# Patient Record
Sex: Male | Born: 1955
Health system: Southern US, Community
[De-identification: ages and names within clinical notes are randomized; demographics above are authoritative.]

## PROBLEM LIST (undated history)

## (undated) DIAGNOSIS — E785 Hyperlipidemia, unspecified: Secondary | ICD-10-CM

## (undated) DIAGNOSIS — E119 Type 2 diabetes mellitus without complications: Secondary | ICD-10-CM

## (undated) DIAGNOSIS — K253 Acute gastric ulcer without hemorrhage or perforation: Secondary | ICD-10-CM

## (undated) DIAGNOSIS — K219 Gastro-esophageal reflux disease without esophagitis: Secondary | ICD-10-CM

## (undated) DIAGNOSIS — F419 Anxiety disorder, unspecified: Secondary | ICD-10-CM

## (undated) HISTORY — PX: SHOULDER SURGERY: SHX246

## (undated) HISTORY — DX: Hyperlipidemia, unspecified: E78.5

## (undated) HISTORY — DX: Anxiety disorder, unspecified: F41.9

---

## 2003-01-27 HISTORY — PX: GASTRIC BYPASS: SHX52

## 2014-04-19 ENCOUNTER — Encounter (HOSPITAL_BASED_OUTPATIENT_CLINIC_OR_DEPARTMENT_OTHER): Payer: Self-pay | Admitting: *Deleted

## 2014-04-19 ENCOUNTER — Emergency Department (HOSPITAL_BASED_OUTPATIENT_CLINIC_OR_DEPARTMENT_OTHER)
Admission: EM | Admit: 2014-04-19 | Discharge: 2014-04-19 | Disposition: A | Discharge status: Home / Self Care | Payer: BC Managed Care – PPO | Attending: Emergency Medicine | Admitting: Emergency Medicine

## 2014-04-19 ENCOUNTER — Emergency Department (HOSPITAL_BASED_OUTPATIENT_CLINIC_OR_DEPARTMENT_OTHER): Payer: BC Managed Care – PPO

## 2014-04-19 DIAGNOSIS — Z87891 Personal history of nicotine dependence: Secondary | ICD-10-CM | POA: Insufficient documentation

## 2014-04-19 DIAGNOSIS — R059 Cough, unspecified: Secondary | ICD-10-CM

## 2014-04-19 DIAGNOSIS — R05 Cough: Secondary | ICD-10-CM

## 2014-04-19 DIAGNOSIS — R1030 Lower abdominal pain, unspecified: Secondary | ICD-10-CM | POA: Diagnosis present

## 2014-04-19 DIAGNOSIS — K219 Gastro-esophageal reflux disease without esophagitis: Secondary | ICD-10-CM | POA: Insufficient documentation

## 2014-04-19 DIAGNOSIS — E119 Type 2 diabetes mellitus without complications: Secondary | ICD-10-CM | POA: Insufficient documentation

## 2014-04-19 DIAGNOSIS — J159 Unspecified bacterial pneumonia: Secondary | ICD-10-CM | POA: Insufficient documentation

## 2014-04-19 DIAGNOSIS — J189 Pneumonia, unspecified organism: Secondary | ICD-10-CM

## 2014-04-19 HISTORY — DX: Gastro-esophageal reflux disease without esophagitis: K21.9

## 2014-04-19 HISTORY — DX: Acute gastric ulcer without hemorrhage or perforation: K25.3

## 2014-04-19 HISTORY — DX: Type 2 diabetes mellitus without complications: E11.9

## 2014-04-19 LAB — COMPREHENSIVE METABOLIC PANEL
ALT: 44 U/L (ref 0–53)
AST: 46 U/L — ABNORMAL HIGH (ref 0–37)
Albumin: 4 g/dL (ref 3.5–5.2)
Alkaline Phosphatase: 50 U/L (ref 39–117)
Anion gap: 7 (ref 5–15)
BUN: 17 mg/dL (ref 6–23)
CHLORIDE: 104 mmol/L (ref 96–112)
CO2: 26 mmol/L (ref 19–32)
Calcium: 9.3 mg/dL (ref 8.4–10.5)
Creatinine, Ser: 1.12 mg/dL (ref 0.50–1.35)
GFR calc Af Amer: 81 mL/min — ABNORMAL LOW (ref >90)
GFR, EST NON AFRICAN AMERICAN: 70 mL/min — AB (ref >90)
GLUCOSE: 263 mg/dL — AB (ref 70–99)
POTASSIUM: 4.2 mmol/L (ref 3.5–5.1)
SODIUM: 137 mmol/L (ref 135–145)
TOTAL PROTEIN: 7.4 g/dL (ref 6.0–8.3)
Total Bilirubin: 0.7 mg/dL (ref 0.3–1.2)

## 2014-04-19 LAB — URINALYSIS, ROUTINE W REFLEX MICROSCOPIC
BILIRUBIN URINE: NEGATIVE
Glucose, UA: >1000 mg/dL — AB
Hgb urine dipstick: NEGATIVE
Ketones, ur: NEGATIVE mg/dL
LEUKOCYTES UA: NEGATIVE
Nitrite: NEGATIVE
PH: 5 (ref 5.0–8.0)
Protein, ur: NEGATIVE mg/dL
Specific Gravity, Urine: 1.035 — ABNORMAL HIGH (ref 1.005–1.030)
Urobilinogen, UA: 0.2 mg/dL (ref 0.0–1.0)

## 2014-04-19 LAB — URINE MICROSCOPIC-ADD ON

## 2014-04-19 LAB — CBC WITH DIFFERENTIAL/PLATELET
Basophils Absolute: 0 10*3/uL (ref 0.0–0.1)
Basophils Relative: 0 % (ref 0–1)
Eosinophils Absolute: 0.1 10*3/uL (ref 0.0–0.7)
Eosinophils Relative: 1 % (ref 0–5)
HEMATOCRIT: 39 % (ref 39.0–52.0)
Hemoglobin: 13.7 g/dL (ref 13.0–17.0)
LYMPHS PCT: 27 % (ref 12–46)
Lymphs Abs: 2.8 10*3/uL (ref 0.7–4.0)
MCH: 30.6 pg (ref 26.0–34.0)
MCHC: 35.1 g/dL (ref 30.0–36.0)
MCV: 87.2 fL (ref 78.0–100.0)
MONOS PCT: 7 % (ref 3–12)
Monocytes Absolute: 0.7 10*3/uL (ref 0.1–1.0)
NEUTROS ABS: 6.8 10*3/uL (ref 1.7–7.7)
NEUTROS PCT: 65 % (ref 43–77)
Platelets: 128 10*3/uL — ABNORMAL LOW (ref 150–400)
RBC: 4.47 MIL/uL (ref 4.22–5.81)
RDW: 12.8 % (ref 11.5–15.5)
WBC: 10.4 10*3/uL (ref 4.0–10.5)

## 2014-04-19 LAB — TROPONIN I: Troponin I: <0.03 ng/mL (ref <0.031)

## 2014-04-19 LAB — LIPASE, BLOOD: Lipase: 27 U/L (ref 11–59)

## 2014-04-19 MED ORDER — LEVOFLOXACIN 750 MG PO TABS
750.0000 mg | ORAL_TABLET | Freq: Once | ORAL | Status: AC
  Administered 2014-04-19: 750 mg via ORAL
  Filled 2014-04-19: qty 1

## 2014-04-19 MED ORDER — ALBUTEROL SULFATE HFA 108 (90 BASE) MCG/ACT IN AERS
2.0000 | INHALATION_SPRAY | RESPIRATORY_TRACT | Status: DC | PRN
  Administered 2014-04-19: 2 via RESPIRATORY_TRACT
  Filled 2014-04-19: qty 6.7

## 2014-04-19 MED ORDER — LEVOFLOXACIN 750 MG PO TABS: 750.0000 mg | ORAL_TABLET | Freq: Every day | ORAL | Status: DC

## 2014-04-19 NOTE — ED Notes (Signed)
Patient transported to X-ray 

## 2014-04-19 NOTE — Discharge Instructions (Signed)

## 2014-04-19 NOTE — ED Notes (Signed)
Pt reports he thinks he aspirated today.  States he has a hx of reflux and today was worse.  Had episodes of coughing and fever today.

## 2014-04-19 NOTE — ED Notes (Signed)
Abdominal pain since last night. This am reflux. Feels like he aspirated. Pain in his right lung and esophagus. Pain went away but he feels congested. Fever this evening.

## 2014-04-19 NOTE — ED Provider Notes (Signed)
CSN: [id number]     Arrival date & time 04/19/14  1751 History  This chart was scribed for Cody Creasehristopher J Pollina, MD by Roxy Cedarhandni Bhalodia, ED Scribe. This patient was seen in room MH10/MH10 and the patient's care was started at [facility]6:12 PM.   Chief Complaint  Patient presents with  . Abdominal Pain   Patient is a 59 y.o. male presenting with abdominal pain. The history is provided by the patient. No language interpreter was used.  Abdominal Pain Pain location:  R flank Pain quality: aching   Pain radiates to:  Chest Pain severity:  Moderate Associated symptoms: chest pain, cough and shortness of breath    HPI Comments: Cody Hoover is a 59 y.o. male with a PMHx of GERD, diabetes and gastric ulcer, who presents to the Emergency Department complaining of 1 episode of emesis and associated right sided abdominal pain and right chest pain that began last night and lasted for a few hours. He reports associated congestion, trouble breathing and weakness. He states that the pain has since subsided but states that "it hurts to cough". He reports onset of associated subjective  Past Medical History  Diagnosis Date  . GERD (gastroesophageal reflux disease)   . Diabetes mellitus without complication   . Ulcer, gastric, acute    Past Surgical History  Procedure Laterality Date  . Shoulder surgery     No family history on file. History  Substance Use Topics  . Smoking status: Former Games developermoker  . Smokeless tobacco: Not on file  . Alcohol Use: Yes   Review of Systems  Respiratory: Positive for cough and shortness of breath.   Cardiovascular: Positive for chest pain.  Gastrointestinal: Positive for abdominal pain.  All other systems reviewed and are negative.  Allergies  Review of patient's allergies indicates no known allergies.  Home Medications   Prior to Admission medications   Medication Sig Start Date End Date Taking? Authorizing Provider  B Complex Vitamins (B-COMPLEX/B-12 SL) Place  under the tongue.   Yes Historical Provider, MD  Omeprazole (PRILOSEC PO) Take by mouth.   Yes Historical Provider, MD   Triage Vitals: BP 96/69 mmHg  Pulse 87  Temp(Src) 98 F (36.7 C) (Oral)  Resp 18  Ht 6' (1.829 m)  Wt 267 lb (121.11 kg)  BMI 36.20 kg/m2  SpO2 97%  Physical Exam  Constitutional: He is oriented to person, place, and time. He appears well-developed and well-nourished. No distress.  HENT:  Head: Normocephalic and atraumatic.  Right Ear: Hearing normal.  Left Ear: Hearing normal.  Nose: Nose normal.  Mouth/Throat: Oropharynx is clear and moist and mucous membranes are normal.  Eyes: Conjunctivae and EOM are normal. Pupils are equal, round, and reactive to light.  Neck: Normal range of motion. Neck supple.  Cardiovascular: Regular rhythm, S1 normal and S2 normal.  Exam reveals no gallop and no friction rub.   No murmur heard. Pulmonary/Chest: Effort normal and breath sounds normal. No respiratory distress. He exhibits no tenderness.  Abdominal: Soft. Normal appearance and bowel sounds are normal. There is no hepatosplenomegaly. There is no tenderness. There is no rebound, no guarding, no tenderness at McBurney's point and negative Murphy's sign. No hernia.  Musculoskeletal: Normal range of motion.  Neurological: He is alert and oriented to person, place, and time. He has normal strength. No cranial nerve deficit or sensory deficit. Coordination normal. GCS eye subscore is 4. GCS verbal subscore is 5. GCS motor subscore is 6.  Skin: Skin is warm, dry  and intact. No rash noted. No cyanosis.  Psychiatric: He has a normal mood and affect. His speech is normal and behavior is normal. Thought content normal.  Nursing note and vitals reviewed.  ED Course  Procedures (including critical care time)  DIAGNOSTIC STUDIES: Oxygen Saturation is 97% on RA, normal by my interpretation.    COORDINATION OF CARE: 6:16 PM- Discussed plans to order diagnostic EKG, CXR and lab work.  Pt advised of plan for treatment and pt agrees.  Labs Review Labs Reviewed  CBC WITH DIFFERENTIAL/PLATELET - Abnormal; Notable for the following:    Platelets 128 (*)    All other components within normal limits  COMPREHENSIVE METABOLIC PANEL - Abnormal; Notable for the following:    Glucose, Bld 263 (*)    AST 46 (*)    GFR calc non Af Amer 70 (*)    GFR calc Af Amer 81 (*)    All other components within normal limits  URINALYSIS, ROUTINE W REFLEX MICROSCOPIC - Abnormal; Notable for the following:    Specific Gravity, Urine 1.035 (*)    Glucose, UA >1000 (*)    All other components within normal limits  LIPASE, BLOOD  TROPONIN I  URINE MICROSCOPIC-ADD ON    Imaging Review Dg Chest 2 View  04/19/2014   CLINICAL DATA:  Sensation of aspiration. Right-sided chest pain. Fever. Initial encounter.  EXAM: CHEST  2 VIEW  COMPARISON:  None.  FINDINGS: The lungs are well-aerated. Mild vascular congestion is noted. Mild central opacities may reflect atelectasis or possibly mild pneumonia, given the patient's symptoms. There is no evidence of pleural effusion or pneumothorax.  The heart is normal in size; the mediastinal contour is within normal limits. No acute osseous abnormalities are seen.  IMPRESSION: Mild vascular congestion noted. Mild central airspace opacities may reflect atelectasis or possibly mild pneumonia, given the patient's symptoms.   Electronically Signed   By: Cody Hoover M.D.   On: 04/19/2014 18:54     EKG Interpretation   Date/Time:  Thursday April 19 2014 18:06:31 EDT Ventricular Rate:  84 PR Interval:  156 QRS Duration: 82 QT Interval:  368 QTC Calculation: 434 R Axis:   54 Text Interpretation:  Normal sinus rhythm Normal ECG Confirmed by POLLINA   MD, CHRISTOPHER [id number]) on 04/19/2014 6:08:27 PM     MDM   Final diagnoses:  Cough  CAP (community acquired pneumonia)    Patient presents to the ER for evaluation of pain and cough. Patient reports that he has  been experiencing epigastric and esophageal burning since this morning. He has a history of acid reflux and that felt similar. Patient reports that he does have a history of a gastric ulcer, thinks he has been having increased symptoms recently. He is scheduled for endoscopy in May.  Patient's blood workis unremarkable. He is not anemic. Workup also included cardiac evaluation. Symptoms do not seem consistent with cardiac etiology. He reports coughing episode associated with the reflux symptoms and that has persisted through the afternoon and evening. He thinks he had a fever earlier. X-ray is suspicious for early pneumonia. Patient is not in any respiratory distress. He is not hypoxic. He will be treated with Levaquin, albuterol.  I personally performed the services described in this documentation, which was scribed in my presence. The recorded information has been reviewed and is accurate.    Cody Crease, MD 04/19/14 [phone]

## 2014-04-19 NOTE — ED Notes (Signed)
Pt returned from xray

## 2014-08-31 ENCOUNTER — Other Ambulatory Visit: Payer: Self-pay

## 2015-08-04 IMAGING — CR DG CHEST 2V
2 series · 2 of 2 positions shown · non-contrast
Comparison: None.

CLINICAL DATA: Sensation of aspiration. Right-sided chest pain.
Fever. Initial encounter.

EXAM:
CHEST  2 VIEW

[w chest pa]
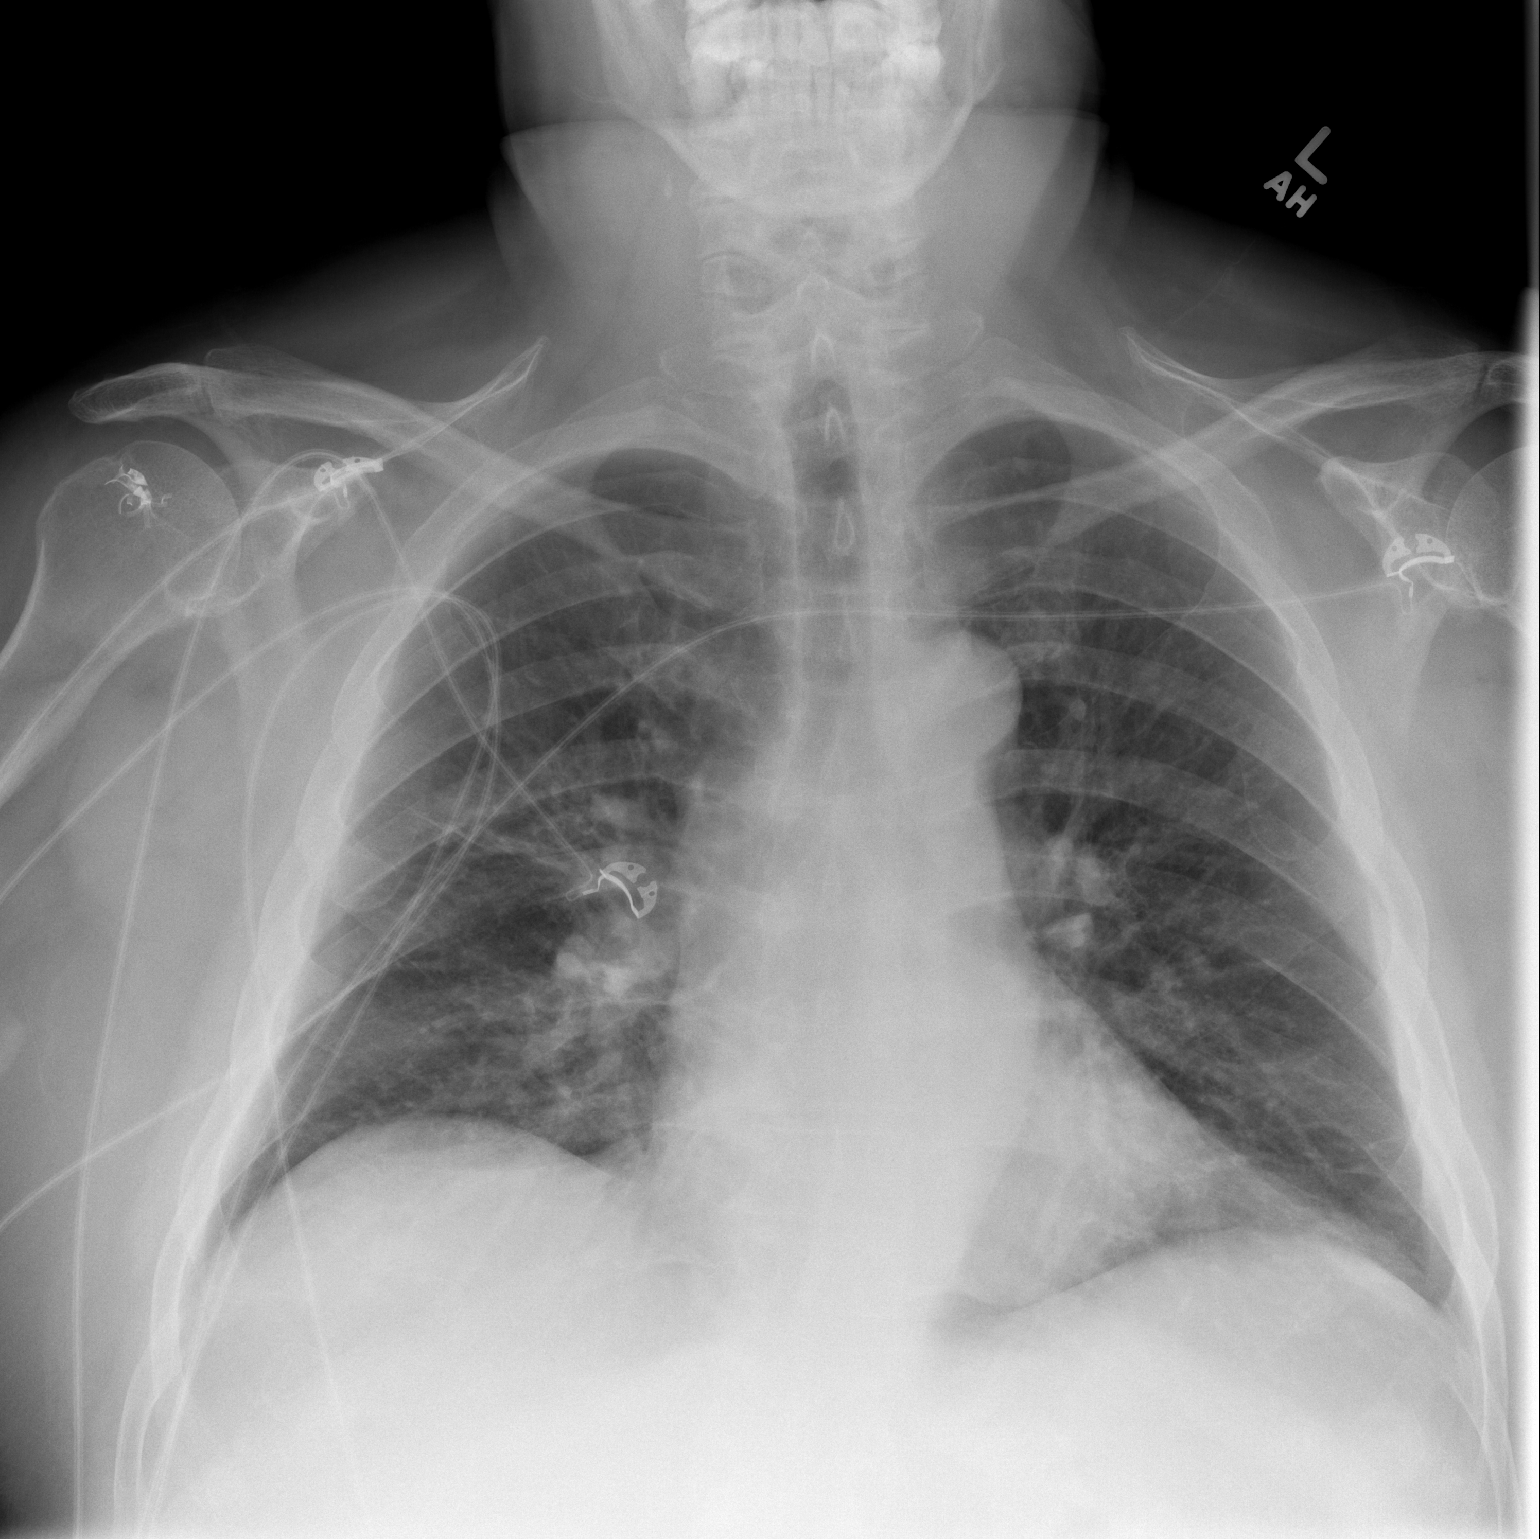

[w chest lat]
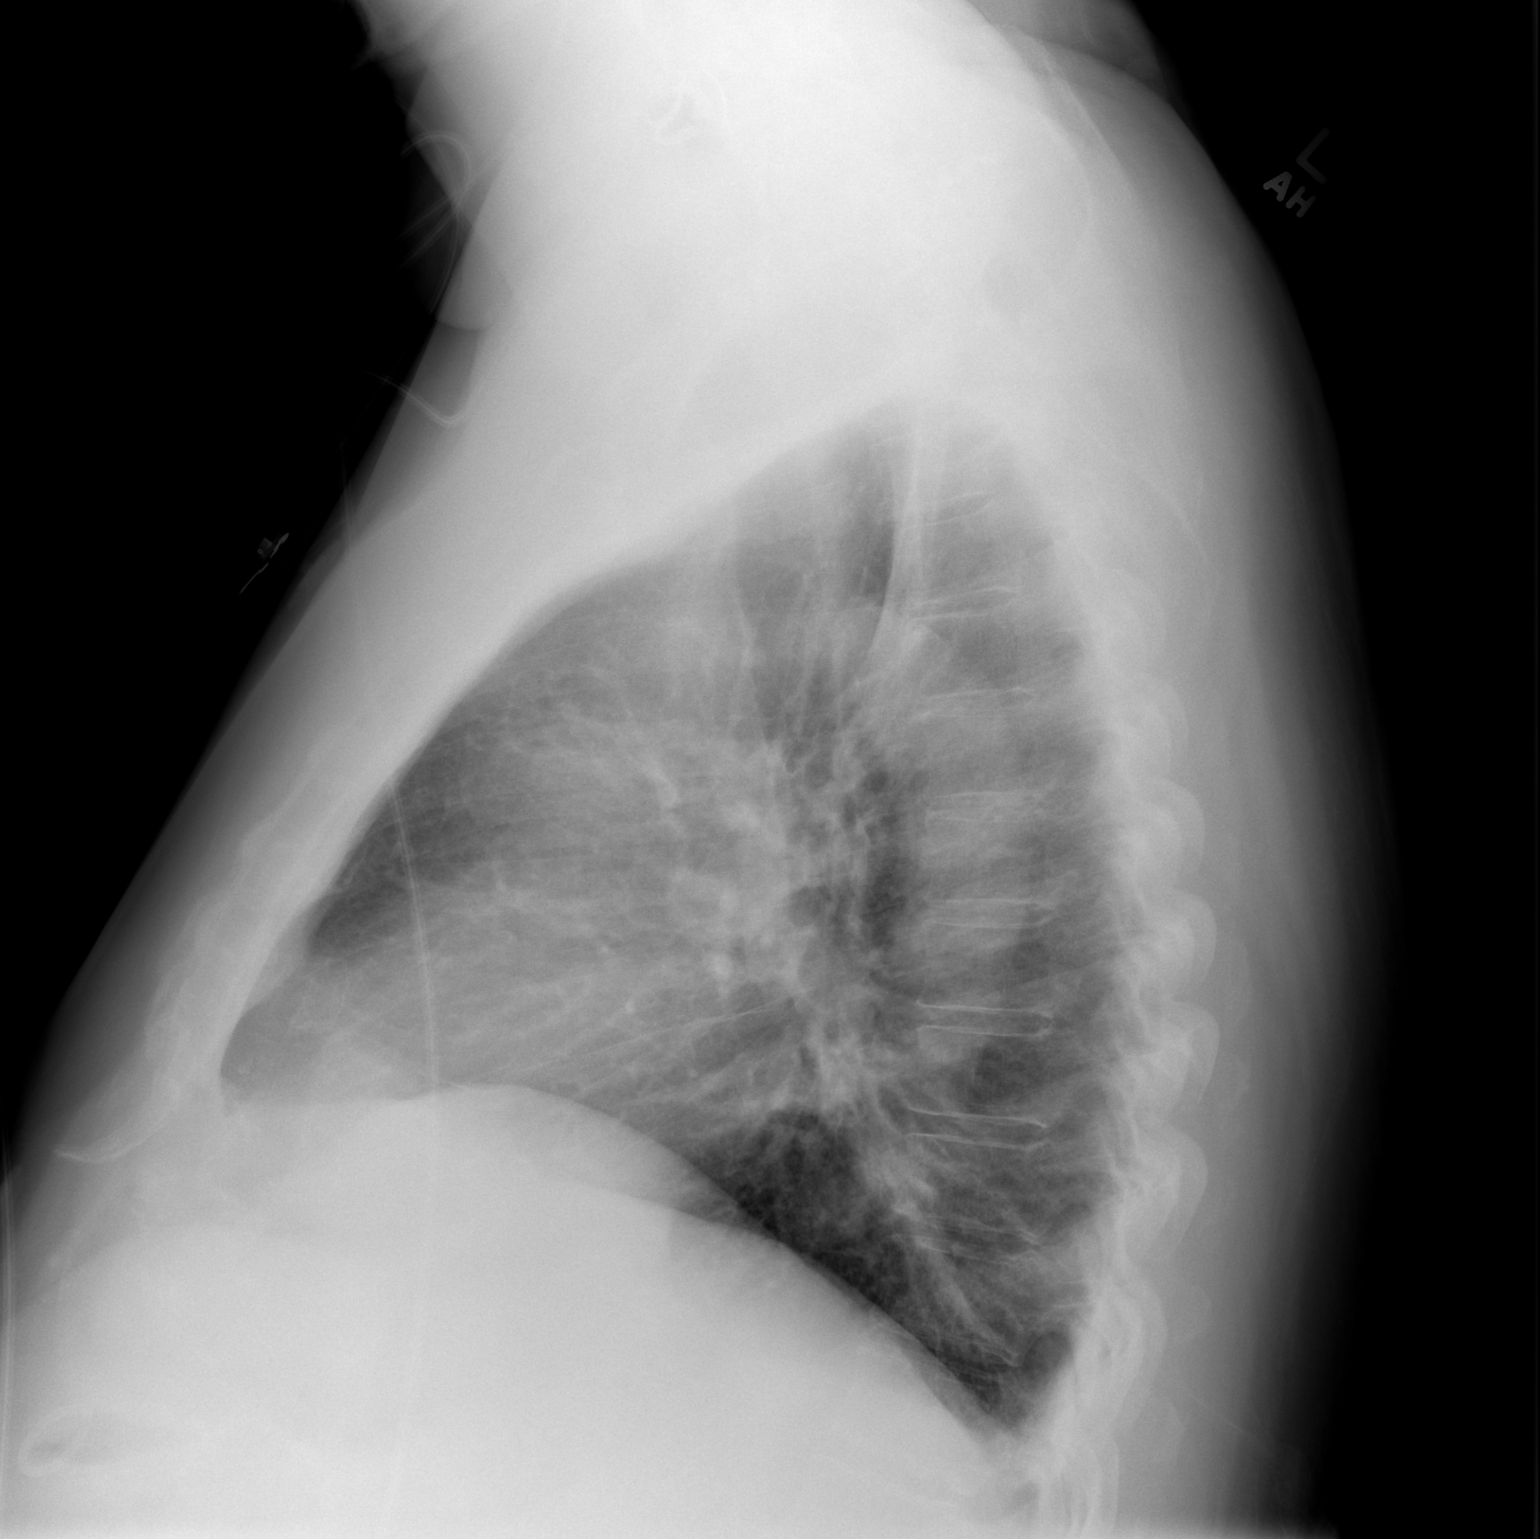

[2 of 2 positions shown; findings below may reference images not displayed]

FINDINGS: The lungs are well-aerated. Mild vascular congestion is noted. Mild
central opacities may reflect atelectasis or possibly mild
pneumonia, given the patient's symptoms. There is no evidence of
pleural effusion or pneumothorax.

The heart is normal in size; the mediastinal contour is within
normal limits. No acute osseous abnormalities are seen.
IMPRESSION: Mild vascular congestion noted. Mild central airspace opacities may
reflect atelectasis or possibly mild pneumonia, given the patient's
symptoms.

## 2019-03-20 ENCOUNTER — Other Ambulatory Visit: Payer: Self-pay | Admitting: Family Medicine

## 2019-03-20 DIAGNOSIS — I739 Peripheral vascular disease, unspecified: Secondary | ICD-10-CM

## 2019-05-03 ENCOUNTER — Ambulatory Visit
Admission: RE | Admit: 2019-05-03 | Discharge: 2019-05-03 | Disposition: A | Discharge status: Home / Self Care | Payer: BC Managed Care – PPO | Source: Ambulatory Visit | Attending: Family Medicine | Admitting: Family Medicine

## 2019-05-03 DIAGNOSIS — Z0184 Encounter for antibody response examination: Secondary | ICD-10-CM | POA: Diagnosis not present

## 2019-05-03 DIAGNOSIS — E1165 Type 2 diabetes mellitus with hyperglycemia: Secondary | ICD-10-CM | POA: Diagnosis not present

## 2019-05-03 DIAGNOSIS — Z8679 Personal history of other diseases of the circulatory system: Secondary | ICD-10-CM | POA: Diagnosis not present

## 2019-05-03 DIAGNOSIS — Z0000 Encounter for general adult medical examination without abnormal findings: Secondary | ICD-10-CM | POA: Diagnosis not present

## 2019-05-03 DIAGNOSIS — E039 Hypothyroidism, unspecified: Secondary | ICD-10-CM | POA: Diagnosis not present

## 2019-05-03 DIAGNOSIS — Z125 Encounter for screening for malignant neoplasm of prostate: Secondary | ICD-10-CM | POA: Diagnosis not present

## 2019-05-03 DIAGNOSIS — I739 Peripheral vascular disease, unspecified: Secondary | ICD-10-CM

## 2019-05-05 DIAGNOSIS — Z20828 Contact with and (suspected) exposure to other viral communicable diseases: Secondary | ICD-10-CM | POA: Diagnosis not present

## 2019-05-09 DIAGNOSIS — E1165 Type 2 diabetes mellitus with hyperglycemia: Secondary | ICD-10-CM | POA: Diagnosis not present

## 2019-05-09 DIAGNOSIS — Z6835 Body mass index (BMI) 35.0-35.9, adult: Secondary | ICD-10-CM | POA: Diagnosis not present

## 2019-05-09 DIAGNOSIS — Z1211 Encounter for screening for malignant neoplasm of colon: Secondary | ICD-10-CM | POA: Diagnosis not present

## 2019-05-09 DIAGNOSIS — Z0000 Encounter for general adult medical examination without abnormal findings: Secondary | ICD-10-CM | POA: Diagnosis not present

## 2019-05-09 DIAGNOSIS — Z23 Encounter for immunization: Secondary | ICD-10-CM | POA: Diagnosis not present

## 2019-05-09 DIAGNOSIS — I739 Peripheral vascular disease, unspecified: Secondary | ICD-10-CM | POA: Diagnosis not present

## 2019-05-09 DIAGNOSIS — R739 Hyperglycemia, unspecified: Secondary | ICD-10-CM | POA: Diagnosis not present

## 2019-05-16 ENCOUNTER — Encounter: Payer: Self-pay | Admitting: Cardiology

## 2019-05-16 ENCOUNTER — Other Ambulatory Visit: Payer: Self-pay

## 2019-05-16 ENCOUNTER — Ambulatory Visit: Payer: Medicare PPO | Admitting: Cardiology

## 2019-05-16 VITALS — BP 108/74 | HR 79 | Ht 72.0 in | Wt 259.0 lb

## 2019-05-16 DIAGNOSIS — R600 Localized edema: Secondary | ICD-10-CM | POA: Diagnosis not present

## 2019-05-16 DIAGNOSIS — Z136 Encounter for screening for cardiovascular disorders: Secondary | ICD-10-CM | POA: Diagnosis not present

## 2019-05-16 DIAGNOSIS — E669 Obesity, unspecified: Secondary | ICD-10-CM

## 2019-05-16 DIAGNOSIS — R6889 Other general symptoms and signs: Secondary | ICD-10-CM | POA: Diagnosis not present

## 2019-05-16 DIAGNOSIS — E119 Type 2 diabetes mellitus without complications: Secondary | ICD-10-CM

## 2019-05-16 MED ORDER — ATORVASTATIN CALCIUM 20 MG PO TABS: 20.0000 mg | ORAL_TABLET | Freq: Every day | ORAL | 3 refills | Status: AC

## 2019-05-16 NOTE — Patient Instructions (Addendum)
Medication Instructions:  Your physician has recommended you make the following change in your medication:  1-START Atorvastatin (Lipitor) 20 mg by mouth daily.  *If you need a refill on your cardiac medications before your next appointment, please call your pharmacy*  Lab Work: If you have labs (blood work) drawn today and your tests are completely normal, you will receive your results only by: Marland Kitchen MyChart Message (if you have MyChart) OR . A paper copy in the mail If you have any lab test that is abnormal or we need to change your treatment, we will call you to review the results.  Testing/Procedures: Your physician has requested that you have an echocardiogram. Echocardiography is a painless test that uses sound waves to create images of your heart. It provides your doctor with information about the size and shape of your heart and how well your heart's chambers and valves are working. This procedure takes approximately one hour. There are no restrictions for this procedure.  Your physician has requested that you have an abdominal aorta duplex. During this test, an ultrasound is used to evaluate the aorta. Allow 30 minutes for this exam. Do not eat after midnight the day before and avoid carbonated beverages.  Follow-Up: At [facility], you and your health needs are our priority.  As part of our continuing mission to provide you with exceptional heart care, we have created designated Provider Care Teams.  These Care Teams include your primary Cardiologist (physician) and Advanced Practice Providers (APPs -  Physician Assistants and Nurse Practitioners) who all work together to provide you with the care you need, when you need it.  We recommend signing up for the patient portal called "MyChart".  Sign up information is provided on this After Visit Summary.  MyChart is used to connect with patients for Virtual Visits (Telemedicine).  Patients are able to view lab/test results, encounter notes,  upcoming appointments, etc.  Non-urgent messages can be sent to your provider as well.   To learn more about what you can do with MyChart, go to [email or web address].    Your next appointment:   3  month(s)  The format for your next appointment:   In Person  Provider:    You will see Dr. Harriet Masson.  You have been referred to Dr. Donzetta Matters at [facility].   Atorvastatin tablets What is this medicine? ATORVASTATIN (a TORE va sta tin) is known as a HMG-CoA reductase inhibitor or 'statin'. It lowers the level of cholesterol and triglycerides in the blood. This drug may also reduce the risk of heart attack, stroke, or other health problems in patients with risk factors for heart disease. Diet and lifestyle changes are often used with this drug. This medicine may be used for other purposes; ask your health care provider or pharmacist if you have questions. COMMON BRAND NAME(S): Lipitor What should I tell my health care provider before I take this medicine? They need to know if you have any of these conditions:  diabetes  if you often drink alcohol  history of stroke  kidney disease  liver disease  muscle aches or weakness  thyroid disease  an unusual or allergic reaction to atorvastatin, other medicines, foods, dyes, or preservatives  pregnant or trying to get pregnant  breast-feeding How should I use this medicine? Take this medicine by mouth with a glass of water. Follow the directions on the prescription label. You can take it with or without food. If it upsets your stomach, take it with food. Do  not take with grapefruit juice. Take your medicine at regular intervals. Do not take it more often than directed. Do not stop taking except on your doctor's advice. Talk to your pediatrician regarding the use of this medicine in children. While this drug may be prescribed for children as young as 10 for selected conditions, precautions do apply. Overdosage: If you think you have taken too much of  this medicine contact a poison control center or emergency room at once. NOTE: This medicine is only for you. Do not share this medicine with others. What if I miss a dose? If you miss a dose, take it as soon as you can. If your next dose is to be taken in less than 12 hours, then do not take the missed dose. Take the next dose at your regular time. Do not take double or extra doses. What may interact with this medicine? Do not take this medicine with any of the following medications:  dasabuvir; ombitasvir; paritaprevir; ritonavir  ombitasvir; paritaprevir; ritonavir  posaconazole  red yeast rice This medicine may also interact with the following medications:  alcohol  birth control pills  certain antibiotics like erythromycin and clarithromycin  certain antivirals for HIV or hepatitis  certain medicines for cholesterol like fenofibrate, gemfibrozil, and niacin  certain medicines for fungal infections like ketoconazole and itraconazole  colchicine  cyclosporine  digoxin  grapefruit juice  rifampin This list may not describe all possible interactions. Give your health care provider a list of all the medicines, herbs, non-prescription drugs, or dietary supplements you use. Also tell them if you smoke, drink alcohol, or use illegal drugs. Some items may interact with your medicine. What should I watch for while using this medicine? Visit your doctor or health care professional for regular check-ups. You may need regular tests to make sure your liver is working properly. Your health care professional may tell you to stop taking this medicine if you develop muscle problems. If your muscle problems do not go away after stopping this medicine, contact your health care professional. Do not become pregnant while taking this medicine. Women should inform their health care professional if they wish to become pregnant or think they might be pregnant. There is a potential for serious  side effects to an unborn child. Talk to your health care professional or pharmacist for more information. Do not breast-feed an infant while taking this medicine. This medicine may increase blood sugar. Ask your healthcare provider if changes in diet or medicines are needed if you have diabetes. If you are going to need surgery or other procedure, tell your doctor that you are using this medicine. This drug is only part of a total heart-health program. Your doctor or a dietician can suggest a low-cholesterol and low-fat diet to help. Avoid alcohol and smoking, and keep a proper exercise schedule. This medicine may cause a decrease in Co-Enzyme Q-10. You should make sure that you get enough Co-Enzyme Q-10 while you are taking this medicine. Discuss the foods you eat and the vitamins you take with your health care professional. What side effects may I notice from receiving this medicine? Side effects that you should report to your doctor or health care professional as soon as possible:  allergic reactions like skin rash, itching or hives, swelling of the face, lips, or tongue  fever  joint pain  loss of memory  redness, blistering, peeling or loosening of the skin, including inside the mouth  signs and symptoms of high blood sugar  such as being more thirsty or hungry or having to urinate more than normal. You may also feel very tired or have blurry vision.  signs and symptoms of liver injury like dark yellow or brown urine; general ill feeling or flu-like symptoms; light-belly pain; unusually weak or tired; yellowing of the eyes or skin  signs and symptoms of muscle injury like dark urine; trouble passing urine or change in the amount of urine; unusually weak or tired; muscle pain or side or back pain Side effects that usually do not require medical attention (report to your doctor or health care professional if they continue or are bothersome):  diarrhea  nausea  stomach pain  trouble  sleeping  upset stomach This list may not describe all possible side effects. Call your doctor for medical advice about side effects. You may report side effects to FDA at 1-800-FDA-1088. Where should I keep my medicine? Keep out of the reach of children. Store between 20 and 25 degrees C (68 and 77 degrees F). Throw away any unused medicine after the expiration date. NOTE: This sheet is a summary. It may not cover all possible information. If you have questions about this medicine, talk to your doctor, pharmacist, or health care provider.  2020 [employer]vier/Gold Standard (2017-11-03 11:36:16)

## 2019-05-16 NOTE — Progress Notes (Signed)
Cardiology Office Note:    Date:  05/16/2019   ID:  Cody Hoover, DOB 11-20-1955, MRN [id number]  PCP:  Herby Abraham, MD  Cardiologist:  No primary care provider on file.  Electrophysiologist:  None   Referring MD: Lewis Moccasin, MD   The patient was referred for abnormal ABI  History of Present Illness:    Cody Hoover is a 64 y.o. male with a hx of Diabetes Mellitus, presents today after he was referred by his PCP for abnormal ABIs.  The patient tells me that he would reported to his PCP that he has been experiencing significant leg pain as he walks.  He notes that every time he walks he felt pain in his legs he does not matter what he was or how far he was going.  Nothing made it better or worse.  He still experiences this.  He denies any chest pain, shortness of breath, nausea, vomiting.  Past Medical History:  Diagnosis Date  . Diabetes mellitus without complication (HCC)   . GERD (gastroesophageal reflux disease)   . Ulcer, gastric, acute     Past Surgical History:  Procedure Laterality Date  . GASTRIC BYPASS  2005  . SHOULDER SURGERY      Current Medications: Current Meds  Medication Sig  . B Complex Vitamins (B-COMPLEX/B-12 SL) Place under the tongue.  Marland Kitchen buPROPion (WELLBUTRIN XL) 150 MG 24 hr tablet 150 mg daily.  Marland Kitchen levofloxacin (LEVAQUIN) 750 MG tablet Take 1 tablet (750 mg total) by mouth daily.  . metFORMIN (GLUCOPHAGE) 500 MG tablet 500 mg in the morning and at bedtime.  . sertraline (ZOLOFT) 25 MG tablet 75 mg daily.     Allergies:   Patient has no known allergies.   Social History   Socioeconomic History  . Marital status: Divorced    Spouse name: Not on file  . Number of children: Not on file  . Years of education: Not on file  . Highest education level: Not on file  Occupational History  . Not on file  Tobacco Use  . Smoking status: Former Smoker    Types: Cigarettes    Quit date: 2013    Years since quitting: 8.3  . Smokeless tobacco:  Never Used  Substance and Sexual Activity  . Alcohol use: Yes    Comment: Beer once a month  . Drug use: No  . Sexual activity: Not on file  Other Topics Concern  . Not on file  Social History Narrative  . Not on file   Social Determinants of Health   Financial Resource Strain:   . Difficulty of Paying Living Expenses:   Food Insecurity:   . Worried About Programme researcher, broadcasting/film/video in the Last Year:   . Barista in the Last Year:   Transportation Needs:   . Freight forwarder (Medical):   Marland Kitchen Lack of Transportation (Non-Medical):   Physical Activity:   . Days of Exercise per Week:   . Minutes of Exercise per Session:   Stress:   . Feeling of Stress :   Social Connections:   . Frequency of Communication with Friends and Family:   . Frequency of Social Gatherings with Friends and Family:   . Attends Religious Services:   . Active Member of Clubs or Organizations:   . Attends Banker Meetings:   Marland Kitchen Marital Status:      Family History: The patient's family history includes Asthma in his paternal grandmother; Bone cancer  in his paternal grandfather; Brain cancer in his sister; Diabetes in his sister; Heart attack in his father; Pancreatic cancer in his mother.  ROS:   Review of Systems  Constitution: Negative for decreased appetite, fever and weight gain.  HENT: Negative for congestion, ear discharge, hoarse voice and sore throat.   Eyes: Negative for discharge, redness, vision loss in right eye and visual halos.  Cardiovascular: Negative for chest pain, dyspnea on exertion, leg swelling, orthopnea and palpitations.  Respiratory: Negative for cough, hemoptysis, shortness of breath and snoring.   Endocrine: Negative for heat intolerance and polyphagia.  Hematologic/Lymphatic: Negative for bleeding problem. Does not bruise/bleed easily.  Skin: Negative for flushing, nail changes, rash and suspicious lesions.  Musculoskeletal: Negative for arthritis, joint pain,  muscle cramps, myalgias, neck pain and stiffness.  Gastrointestinal: Negative for abdominal pain, bowel incontinence, diarrhea and excessive appetite.  Genitourinary: Negative for decreased libido, genital sores and incomplete emptying.  Neurological: Negative for brief paralysis, focal weakness, headaches and loss of balance.  Psychiatric/Behavioral: Negative for altered mental status, depression and suicidal ideas.  Allergic/Immunologic: Negative for HIV exposure and persistent infections.    EKGs/Labs/Other Studies Reviewed:    The following studies were reviewed today:   EKG:  The ekg ordered today demonstrates sinus rhythm, heart rate 78 bpm EKG done in 2016 showed poor R wave progression in V1 and V2 is no longer present.   Arterial US  COMPARISON:  None. FINDINGS: Additional: IMPRESSION: Resting ABI the bilateral lower extremities are non attainable given non compressible vasculature. The segmental exam demonstrates no significant femoropopliteal disease bilaterally, with developing tibial disease bilaterally.The measured systolic pressure at the left great toe confers borderline wound healing capability.   Recent Labs: No results found for requested labs within last 8760 hours.  Recent Lipid Panel No results found for: CHOL, TRIG, HDL, CHOLHDL, VLDL, LDLCALC, LDLDIRECT  Physical Exam:    VS:  BP 108/74 (BP Location: Right Arm, Patient Position: Sitting, Cuff Size: Large)   Pulse 79   Ht 6' (1.829 m)   Wt 259 lb (117.5 kg)   SpO2 96%   BMI 35.13 kg/m     Wt Readings from Last 3 Encounters:  05/16/19 259 lb (117.5 kg)  04/19/14 267 lb (121.1 kg)     GEN: Well nourished, well developed in no acute distress HEENT: Normal NECK: No JVD; No carotid bruits LYMPHATICS: No lymphadenopathy CARDIAC: S1S2 noted,RRR, no murmurs, rubs, gallops RESPIRATORY:  Clear to auscultation without rales, wheezing or rhonchi  ABDOMEN: Soft, non-tender, truncal obesity, +bowel  sounds, no guarding. EXTREMITIES: No edema, No cyanosis, no clubbing, +2 bilateral DP pulses MUSCULOSKELETAL:  No deformity  SKIN: Warm and dry NEUROLOGIC:  Alert and oriented x 3, non-focal PSYCHIATRIC:  Normal affect, good insight  ASSESSMENT:    1. Abnormal ankle brachial index (ABI)   2. Bilateral lower extremity edema   3. Screening for AAA (aortic abdominal aneurysm)   4. Type 2 diabetes mellitus without complication, without long-term current use of insulin (HCC)   5. Obesity (BMI 30-39.9)    PLAN:    His ankle-brachial his leg pain index did show evidence of noncompressible vessel which usually indicate ABI greater than 1.4.  Usually seen in patients with diabetes and highly calcified vessels.  I did educate the patient about possible reasons why his test may show noncompressibility which include his diabetes versus calcification of the vessels.  He is still having leg pain when he walks.  Exercise ABIs may be of  value here which is also noninvasive.  In the meantime I like to start the patient on Lipitor 20 mg daily.  I will send him to vascular surgery for further evaluation as his leg pain is actually interfering with his daily activities.  He does have a history of smoking at this time I like to screen the patient for abdominal aortic aneurysm with him ultrasound of his abdomen.  Diabetes mellitus-continue his current medication regimen.  Start of moderate intensity statin at this time.  Bilateral leg edema-I did educate the patient about use of compression stockings, he is willing to try this for now.  Will reassess and make further recommendation at his next appointment.  In the meantime a transthoracic echocardiogram will be reasonable to assess his LV/RV function as well as for any structural abnormalities.  Obesity -the patient understands the need to lose weight with diet and exercise. We have discussed specific strategies for this.  The patient is in agreement with the  above plan. The patient left the office in stable condition.  The patient will follow up in 3 months or sooner if needed.   Medication Adjustments/Labs and Tests Ordered: Current medicines are reviewed at length with the patient today.  Concerns regarding medicines are outlined above.  Orders Placed This Encounter  Procedures  . Ambulatory referral to Vascular Surgery  . EKG 12-Lead  . ECHOCARDIOGRAM COMPLETE  . VAS [place] AAA DUPLEX   Meds ordered this encounter  Medications  . atorvastatin (LIPITOR) 20 MG tablet    Sig: Take 1 tablet (20 mg total) by mouth daily.    Dispense:  90 tablet    Refill:  3    Patient Instructions  Medication Instructions:  Your physician has recommended you make the following change in your medication:  1-START Atorvastatin (Lipitor) 20 mg by mouth daily.  *If you need a refill on your cardiac medications before your next appointment, please call your pharmacy*  Lab Work: If you have labs (blood work) drawn today and your tests are completely normal, you will receive your results only by: Marland Kitchen MyChart Message (if you have MyChart) OR . A paper copy in the mail If you have any lab test that is abnormal or we need to change your treatment, we will call you to review the results.  Testing/Procedures: Your physician has requested that you have an echocardiogram. Echocardiography is a painless test that uses sound waves to create images of your heart. It provides your doctor with information about the size and shape of your heart and how well your heart's chambers and valves are working. This procedure takes approximately one hour. There are no restrictions for this procedure.  Your physician has requested that you have an abdominal aorta duplex. During this test, an ultrasound is used to evaluate the aorta. Allow 30 minutes for this exam. Do not eat after midnight the day before and avoid carbonated beverages.  Follow-Up: At [facility], you and your  health needs are our priority.  As part of our continuing mission to provide you with exceptional heart care, we have created designated Provider Care Teams.  These Care Teams include your primary Cardiologist (physician) and Advanced Practice Providers (APPs -  Physician Assistants and Nurse Practitioners) who all work together to provide you with the care you need, when you need it.  We recommend signing up for the patient portal called "MyChart".  Sign up information is provided on this After Visit Summary.  MyChart is used to  connect with patients for Virtual Visits (Telemedicine).  Patients are able to view lab/test results, encounter notes, upcoming appointments, etc.  Non-urgent messages can be sent to your provider as well.   To learn more about what you can do with MyChart, go to [email or web address].    Your next appointment:   3  month(s)  The format for your next appointment:   In Person  Provider:    You will see Dr. Harriet Masson.  You have been referred to Dr. Donzetta Matters at [facility].   Atorvastatin tablets What is this medicine? ATORVASTATIN (a TORE va sta tin) is known as a HMG-CoA reductase inhibitor or 'statin'. It lowers the level of cholesterol and triglycerides in the blood. This drug may also reduce the risk of heart attack, stroke, or other health problems in patients with risk factors for heart disease. Diet and lifestyle changes are often used with this drug. This medicine may be used for other purposes; ask your health care provider or pharmacist if you have questions. COMMON BRAND NAME(S): Lipitor What should I tell my health care provider before I take this medicine? They need to know if you have any of these conditions:  diabetes  if you often drink alcohol  history of stroke  kidney disease  liver disease  muscle aches or weakness  thyroid disease  an unusual or allergic reaction to atorvastatin, other medicines, foods, dyes, or preservatives  pregnant or trying  to get pregnant  breast-feeding How should I use this medicine? Take this medicine by mouth with a glass of water. Follow the directions on the prescription label. You can take it with or without food. If it upsets your stomach, take it with food. Do not take with grapefruit juice. Take your medicine at regular intervals. Do not take it more often than directed. Do not stop taking except on your doctor's advice. Talk to your pediatrician regarding the use of this medicine in children. While this drug may be prescribed for children as young as 10 for selected conditions, precautions do apply. Overdosage: If you think you have taken too much of this medicine contact a poison control center or emergency room at once. NOTE: This medicine is only for you. Do not share this medicine with others. What if I miss a dose? If you miss a dose, take it as soon as you can. If your next dose is to be taken in less than 12 hours, then do not take the missed dose. Take the next dose at your regular time. Do not take double or extra doses. What may interact with this medicine? Do not take this medicine with any of the following medications:  dasabuvir; ombitasvir; paritaprevir; ritonavir  ombitasvir; paritaprevir; ritonavir  posaconazole  red yeast rice This medicine may also interact with the following medications:  alcohol  birth control pills  certain antibiotics like erythromycin and clarithromycin  certain antivirals for HIV or hepatitis  certain medicines for cholesterol like fenofibrate, gemfibrozil, and niacin  certain medicines for fungal infections like ketoconazole and itraconazole  colchicine  cyclosporine  digoxin  grapefruit juice  rifampin This list may not describe all possible interactions. Give your health care provider a list of all the medicines, herbs, non-prescription drugs, or dietary supplements you use. Also tell them if you smoke, drink alcohol, or use illegal  drugs. Some items may interact with your medicine. What should I watch for while using this medicine? Visit your doctor or health care professional for regular check-ups. You may  need regular tests to make sure your liver is working properly. Your health care professional may tell you to stop taking this medicine if you develop muscle problems. If your muscle problems do not go away after stopping this medicine, contact your health care professional. Do not become pregnant while taking this medicine. Women should inform their health care professional if they wish to become pregnant or think they might be pregnant. There is a potential for serious side effects to an unborn child. Talk to your health care professional or pharmacist for more information. Do not breast-feed an infant while taking this medicine. This medicine may increase blood sugar. Ask your healthcare provider if changes in diet or medicines are needed if you have diabetes. If you are going to need surgery or other procedure, tell your doctor that you are using this medicine. This drug is only part of a total heart-health program. Your doctor or a dietician can suggest a low-cholesterol and low-fat diet to help. Avoid alcohol and smoking, and keep a proper exercise schedule. This medicine may cause a decrease in Co-Enzyme Q-10. You should make sure that you get enough Co-Enzyme Q-10 while you are taking this medicine. Discuss the foods you eat and the vitamins you take with your health care professional. What side effects may I notice from receiving this medicine? Side effects that you should report to your doctor or health care professional as soon as possible:  allergic reactions like skin rash, itching or hives, swelling of the face, lips, or tongue  fever  joint pain  loss of memory  redness, blistering, peeling or loosening of the skin, including inside the mouth  signs and symptoms of high blood sugar such as being more  thirsty or hungry or having to urinate more than normal. You may also feel very tired or have blurry vision.  signs and symptoms of liver injury like dark yellow or brown urine; general ill feeling or flu-like symptoms; light-belly pain; unusually weak or tired; yellowing of the eyes or skin  signs and symptoms of muscle injury like dark urine; trouble passing urine or change in the amount of urine; unusually weak or tired; muscle pain or side or back pain Side effects that usually do not require medical attention (report to your doctor or health care professional if they continue or are bothersome):  diarrhea  nausea  stomach pain  trouble sleeping  upset stomach This list may not describe all possible side effects. Call your doctor for medical advice about side effects. You may report side effects to FDA at 1-800-FDA-1088. Where should I keep my medicine? Keep out of the reach of children. Store between 20 and 25 degrees C (68 and 77 degrees F). Throw away any unused medicine after the expiration date. NOTE: This sheet is a summary. It may not cover all possible information. If you have questions about this medicine, talk to your doctor, pharmacist, or health care provider.  2020 Elsevier/Gold Standard (2017-11-03 11:36:16)     Adopting a Healthy Lifestyle.  Know what a healthy weight is for you (roughly BMI <25) and aim to maintain this   Aim for 7+ servings of fruits and vegetables daily   65-80+ fluid ounces of water or unsweet tea for healthy kidneys   Limit to max 1 drink of alcohol per day; avoid smoking/tobacco   Limit animal fats in diet for cholesterol and heart health - choose grass fed whenever available   Avoid highly processed foods, and foods high  in saturated/trans fats   Aim for low stress - take time to unwind and care for your mental health   Aim for 150 min of moderate intensity exercise weekly for heart health, and weights twice weekly for bone health    Aim for 7-9 hours of sleep daily   When it comes to diets, agreement about the perfect plan isnt easy to find, even among the experts. Experts at the [facility] of [employer] developed an idea known as the Healthy Eating Plate. Just imagine a plate divided into logical, healthy portions.   The emphasis is on diet quality:   Load up on vegetables and fruits - one-half of your plate: Aim for color and variety, and remember that potatoes dont count.   Go for whole grains - one-quarter of your plate: Whole wheat, barley, wheat berries, quinoa, oats, brown rice, and foods made with them. If you want pasta, go with whole wheat pasta.   Protein power - one-quarter of your plate: Fish, chicken, beans, and nuts are all healthy, versatile protein sources. Limit red meat.   The diet, however, does go beyond the plate, offering a few other suggestions.   Use healthy plant oils, such as olive, canola, soy, corn, sunflower and peanut. Check the labels, and avoid partially hydrogenated oil, which have unhealthy trans fats.   If youre thirsty, drink water. Coffee and tea are good in moderation, but skip sugary drinks and limit milk and dairy products to one or two daily servings.   The type of carbohydrate in the diet is more important than the amount. Some sources of carbohydrates, such as vegetables, fruits, whole grains, and beans-are healthier than others.   Finally, stay active  Signed, Thomasene Ripple, DO  05/16/2019 3:55 PM    [facility]

## 2019-06-08 ENCOUNTER — Ambulatory Visit: Payer: Medicare PPO

## 2019-06-08 ENCOUNTER — Other Ambulatory Visit: Payer: Self-pay

## 2019-06-08 DIAGNOSIS — E119 Type 2 diabetes mellitus without complications: Secondary | ICD-10-CM

## 2019-06-08 DIAGNOSIS — R6889 Other general symptoms and signs: Secondary | ICD-10-CM

## 2019-06-08 DIAGNOSIS — R600 Localized edema: Secondary | ICD-10-CM

## 2019-06-08 DIAGNOSIS — Z136 Encounter for screening for cardiovascular disorders: Secondary | ICD-10-CM

## 2019-06-08 NOTE — Progress Notes (Addendum)
Complete Abdominal aortic duplex exam has been performed.  Jimmy Roselind Klus RDCS, RVT

## 2019-06-09 ENCOUNTER — Telehealth: Payer: Self-pay

## 2019-06-09 ENCOUNTER — Ambulatory Visit (INDEPENDENT_AMBULATORY_CARE_PROVIDER_SITE_OTHER): Payer: Medicare PPO

## 2019-06-09 DIAGNOSIS — E119 Type 2 diabetes mellitus without complications: Secondary | ICD-10-CM

## 2019-06-09 DIAGNOSIS — R6889 Other general symptoms and signs: Secondary | ICD-10-CM

## 2019-06-09 DIAGNOSIS — R600 Localized edema: Secondary | ICD-10-CM | POA: Diagnosis not present

## 2019-06-09 NOTE — Progress Notes (Signed)
Complete echocardiogram performed.  Jimmy Rebbie Lauricella RDCS, RVT  

## 2019-06-09 NOTE — Telephone Encounter (Signed)
Spoke with patient regarding results and recommendation.  Patient verbalizes understanding and is agreeable to plan of care. Advised patient to call back with any issues or concerns.  

## 2019-06-09 NOTE — Telephone Encounter (Signed)
-----   Message from Thomasene Ripple, DO sent at 06/09/2019 12:33 PM EDT ----- For his ultrasound of the abdomen his abdominal aorta is slightly dilated but nothing to do right now we will continue to monitor.

## 2019-06-16 DIAGNOSIS — F331 Major depressive disorder, recurrent, moderate: Secondary | ICD-10-CM | POA: Diagnosis not present

## 2019-06-16 DIAGNOSIS — R5383 Other fatigue: Secondary | ICD-10-CM | POA: Diagnosis not present

## 2019-06-16 DIAGNOSIS — F411 Generalized anxiety disorder: Secondary | ICD-10-CM | POA: Diagnosis not present

## 2019-06-16 DIAGNOSIS — I739 Peripheral vascular disease, unspecified: Secondary | ICD-10-CM | POA: Diagnosis not present

## 2019-06-30 ENCOUNTER — Ambulatory Visit (INDEPENDENT_AMBULATORY_CARE_PROVIDER_SITE_OTHER): Payer: Medicare PPO | Admitting: Vascular Surgery

## 2019-06-30 ENCOUNTER — Encounter: Payer: Self-pay | Admitting: Vascular Surgery

## 2019-06-30 ENCOUNTER — Other Ambulatory Visit: Payer: Self-pay

## 2019-06-30 VITALS — BP 113/73 | HR 72 | Temp 98.1°F | Resp 20 | Ht 72.0 in | Wt 260.0 lb

## 2019-06-30 DIAGNOSIS — M7989 Other specified soft tissue disorders: Secondary | ICD-10-CM | POA: Diagnosis not present

## 2019-06-30 NOTE — Progress Notes (Signed)
Patient ID: Cody Hoover, male   DOB: 11-22-1955, 64 y.o.   MRN: [id number]  Reason for Consult: New Patient (Initial Visit)   Referred by Thomasene Ripple, DO  Subjective:     HPI:  Cody Hoover is a 64 y.o. male without previous vascular disease and has diabetes as a risk factor as well as hyperlipidemia.  States that he has bilateral lower extremity swelling discoloration of his ankles that has been present for many years.  Has burning pain bilateral feet.  This is not exacerbated with walking.  Does have some pain with walking in his knees.  Also has arthritis in his hands.  He has previously worn compression stockings but does have difficulty with this.  Has swelling without tissue loss or ulceration.  Mostly bothered by the pain in his feet.  Denies any history of aneurysm either personal or family and does not have any history of stroke TIA or amaurosis.  Past Medical History:  Diagnosis Date  . Anxiety   . Diabetes mellitus without complication (HCC)   . GERD (gastroesophageal reflux disease)   . Hyperlipidemia   . Ulcer, gastric, acute    Family History  Problem Relation Age of Onset  . Pancreatic cancer Mother   . Heart attack Father   . Diabetes Sister   . Asthma Paternal Grandmother   . Bone cancer Paternal Grandfather   . Brain cancer Sister    Past Surgical History:  Procedure Laterality Date  . GASTRIC BYPASS  2005  . SHOULDER SURGERY      Short Social History:  Social History   Tobacco Use  . Smoking status: Former Smoker    Types: Cigarettes    Quit date: 2013    Years since quitting: 8.4  . Smokeless tobacco: Never Used  Substance Use Topics  . Alcohol use: Yes    Comment: Beer once a month    No Known Allergies  Current Outpatient Medications  Medication Sig Dispense Refill  . atorvastatin (LIPITOR) 20 MG tablet Take 1 tablet (20 mg total) by mouth daily. 90 tablet 3  . B Complex Vitamins (B-COMPLEX/B-12 SL) Place under the tongue.    Marland Kitchen  buPROPion (WELLBUTRIN XL) 300 MG 24 hr tablet     . metFORMIN (GLUCOPHAGE) 500 MG tablet 500 mg in the morning and at bedtime.    . sertraline (ZOLOFT) 100 MG tablet     . clopidogrel (PLAVIX) 75 MG tablet      No current facility-administered medications for this visit.    Review of Systems  Constitutional:  Constitutional negative. HENT: HENT negative.  Eyes: Eyes negative.  Respiratory: Respiratory negative.  Cardiovascular: Positive for leg swelling.  GI: Gastrointestinal negative.  Musculoskeletal: Positive for leg pain.  Skin: Skin negative.  Neurological: Neurological negative. Hematologic: Hematologic/lymphatic negative.  Psychiatric: Psychiatric negative.        Objective:  Objective   Vitals:   06/30/19 1217  BP: 113/73  Pulse: 72  Resp: 20  Temp: 98.1 F (36.7 C)  SpO2: 95%  Weight: 260 lb (117.9 kg)  Height: 6' (1.829 m)   Body mass index is 35.26 kg/m.  Physical Exam HENT:     Nose:     Comments: Mask in place Eyes:     Pupils: Pupils are equal, round, and reactive to light.  Neck:     Vascular: No carotid bruit.  Cardiovascular:     Rate and Rhythm: Normal rate.     Pulses: Normal pulses.  Pulmonary:  Effort: Pulmonary effort is normal.     Breath sounds: Normal breath sounds.  Abdominal:     General: Abdomen is flat.     Palpations: Abdomen is soft.  Musculoskeletal:        General: Swelling present. Normal range of motion.  Skin:    General: Skin is warm.     Capillary Refill: Capillary refill takes less than 2 seconds.     Comments:  brown discolored left greater than right leg  Neurological:     General: No focal deficit present.     Mental Status: He is alert.  Psychiatric:        Mood and Affect: Mood normal.        Thought Content: Thought content normal.        Judgment: Judgment normal.     Data: ABI IMPRESSION: Resting ABI the bilateral lower extremities are non attainable given non compressible  vasculature.  The segmental exam demonstrates no significant femoropopliteal disease bilaterally, with developing tibial disease bilaterally.  The measured systolic pressure at the left great toe confers borderline wound healing capability.      Assessment/Plan:     64 year old male here with bilateral foot pain and swelling in his bilateral ankles with hemosiderin deposition.  Swelling is mild at best.  I recommended compression stockings for this.  Pain likely secondary to musculoskeletal and neuropathic reasons as patient appears to have adequate blood flow to the feet with brisk capillary refill and palpable pulses distally.  I discussed with him we could evaluate his veins for reflux at this time patient does not want any procedures that are not absolutely necessary and he can follow-up with me on an as-needed basis.     Waynetta Sandy MD [facility], [place]

## 2019-07-11 ENCOUNTER — Ambulatory Visit: Payer: Medicare PPO | Admitting: Neurology

## 2019-07-11 ENCOUNTER — Other Ambulatory Visit: Payer: Self-pay

## 2019-07-11 ENCOUNTER — Encounter: Payer: Self-pay | Admitting: Neurology

## 2019-07-11 VITALS — BP 130/77 | HR 78 | Ht 72.0 in | Wt 255.0 lb

## 2019-07-11 DIAGNOSIS — E1151 Type 2 diabetes mellitus with diabetic peripheral angiopathy without gangrene: Secondary | ICD-10-CM

## 2019-07-11 DIAGNOSIS — M2619 Other specified anomalies of jaw-cranial base relationship: Secondary | ICD-10-CM

## 2019-07-11 DIAGNOSIS — G4719 Other hypersomnia: Secondary | ICD-10-CM

## 2019-07-11 DIAGNOSIS — Z9884 Bariatric surgery status: Secondary | ICD-10-CM

## 2019-07-11 DIAGNOSIS — G478 Other sleep disorders: Secondary | ICD-10-CM | POA: Diagnosis not present

## 2019-07-11 NOTE — Progress Notes (Signed)
SLEEP MEDICINE CLINIC    Provider:  Melvyn Novas, MD  Primary Care Physician:  Lewis Moccasin, MD [address] [place] [address]     Referring Provider: Herby Abraham, Md [address] [place],  [place] [address]          Chief Complaint according to patient   Patient presents with:    . New Patient (Initial Visit)     patient alone, rm 10. pt presents today with difficulty with sleep. states that he feels could be related to current living situations that he is not sleeping well. describe that as afternoon progresses he gets extremely tired and could fall asleep if sitting. he also mentioned that he has difficulty with pain in bilateral feet. he has noticed that this presents itself at bedtime. last SS > 20 yrs ago and he did use a CPAP until he lost 100 lbs.      HISTORY OF PRESENT ILLNESS:  Cody Hoover is a 64 y.o. year old  Caucasian male patient , status post bariatric surgery and 100 pound weight loss, and seen upon a referral on 07/11/2019 from Dr. Duanne Guess, local PCP, and from Dr. Crista Elliot at [facility].    Chief concern according to patient :  " not sleeping as much, and not the best quality to my sleep" - the patient disovered  mold in his home and has slept in another room - on a air mattress, and the room not being well ventilated. He denies snoring , lives alone.  He is unaware of any mold related health symptoms to him.   I have the pleasure of seeing Cody Hoover today, a right -handed Caucasian male with a possible sleep disorder.  he   has a past medical history of Anxiety, Diabetes mellitus without complication (HCC), GERD (gastroesophageal reflux disease), Hyperlipidemia, and Ulcer, gastric, and underwent bariatric gastric bypass surgery in 2005- lost 100 pounds, reached 236 pounds. He weighted # 350 at his heaviest time.    The patient had the first sleep study in the year 2000, before bariatric surgery- and none to follow the procedure.   He  used CPAP with difficulties, had nasal airflow issues, seasonal allergies.- he used CPAP for several years did not like it much.  He PS :  uses Nasocort now.    Sleep relevant medical history: Nocturia may be once - remote history of Night terror - never had a Tonsillectomy.  Family medical /sleep history:son is obese and on CPAP with OSA, insomnia, sleep walkers. son , Cody Hoover is 62 and has OSA.    Social history:  Patient is retired from Patent examiner - Transport planner, Veterinary surgeon, Therapist, occupational. He worked many years on night shifts.  He  lives in a household alone. Family status is widowed, with 2 sons, 85 and 40 years old - several grandchildren. Pets are present. 2 cats and a dog.  Tobacco use- quit 2013.   ETOH use : 2-3 / a week.   Caffeine intake in form of Coffee( 2 cups ). Regular exercise - restricted, he walks some.  Hobbies : home improvements    Sleep habits are as follows: The patient's dinner time is between 6-7 PM.  The patient goes to bed at 11-12 PM and continues to sleep for 4-6 hours. Unsure of h what wakes him up.  The preferred sleep position is supine , left side , with the support of 2 pillows. His bedroom has  open , uncovered windows.   Dreams are reportedly rare.  7  AM is the usual rise time. The patient wakes up spontaneously, sometimes hours before.  He reports not feeling refreshed or restored in AM, with symptoms such as dry mouth and residual fatigue. Naps are taken very frequently, lasting from 20 to 60 minutes and are more refreshing than nocturnal sleep.    Review of Systems: Out of a complete 14 system review, the patient complains of only the following symptoms, and all other reviewed systems are negative.:   Tinnitus -Fatigue, sleepiness , snoring, fragmented sleep, Insomnia- difficulties getting  enough sleep/ Some foot pain.  How likely are you to doze in the following situations: 0 = not likely, 1 = slight chance, 2 = moderate chance, 3 = high  chance   Sitting and Reading? Watching Television? Sitting inactive in a public place (theater or meeting)? As a passenger in a car for an hour without a break? Lying down in the afternoon when circumstances permit? Sitting and talking to someone? Sitting quietly after lunch without alcohol? In a car, while stopped for a few minutes in traffic?   Total = 13- 15 / 24 points   FSS endorsed at 43/ 63 points.   Social History   Socioeconomic History  . Marital status: Divorced    Spouse name: Not on file  . Number of children: Not on file  . Years of education: Not on file  . Highest education level: Not on file  Occupational History  . Not on file  Tobacco Use  . Smoking status: Former Smoker    Types: Cigarettes    Quit date: 2013    Years since quitting: 8.4  . Smokeless tobacco: Never Used  Vaping Use  . Vaping Use: Never used  Substance and Sexual Activity  . Alcohol use: Yes    Comment: Beer once a month  . Drug use: No  . Sexual activity: Not on file  Other Topics Concern  . Not on file  Social History Narrative  . Not on file   Social Determinants of Health   Financial Resource Strain:   . Difficulty of Paying Living Expenses:   Food Insecurity:   . Worried About Programme researcher, broadcasting/film/video in the Last Year:   . Barista in the Last Year:   Transportation Needs:   . Freight forwarder (Medical):   Marland Kitchen Lack of Transportation (Non-Medical):   Physical Activity:   . Days of Exercise per Week:   . Minutes of Exercise per Session:   Stress:   . Feeling of Stress :   Social Connections:   . Frequency of Communication with Friends and Family:   . Frequency of Social Gatherings with Friends and Family:   . Attends Religious Services:   . Active Member of Clubs or Organizations:   . Attends Banker Meetings:   Marland Kitchen Marital Status:     Family History  Problem Relation Age of Onset  . Pancreatic cancer Mother   . Heart attack Father   .  Diabetes Sister   . Asthma Paternal Grandmother   . Bone cancer Paternal Grandfather   . Brain cancer Sister     Past Medical History:  Diagnosis Date  . Anxiety   . Diabetes mellitus without complication (HCC)   . GERD (gastroesophageal reflux disease)   . Hyperlipidemia   . Ulcer, gastric, acute     Past Surgical History:  Procedure Laterality  Date  . GASTRIC BYPASS  2005  . SHOULDER SURGERY       Current Outpatient Medications on File Prior to Visit  Medication Sig Dispense Refill  . atorvastatin (LIPITOR) 20 MG tablet Take 1 tablet (20 mg total) by mouth daily. 90 tablet 3  . B Complex Vitamins (B-COMPLEX/B-12 SL) Place under the tongue.    Marland Kitchen buPROPion (WELLBUTRIN XL) 300 MG 24 hr tablet     . clopidogrel (PLAVIX) 75 MG tablet     . metFORMIN (GLUCOPHAGE) 500 MG tablet 500 mg in the morning and at bedtime.    . sertraline (ZOLOFT) 100 MG tablet      No current facility-administered medications on file prior to visit.   Physical exam:  Today's Vitals   07/11/19 1305  BP: 130/77  Pulse: 78  Weight: 255 lb (115.7 kg)  Height: 6' (1.829 m)   Body mass index is 34.58 kg/m.   Wt Readings from Last 3 Encounters:  07/11/19 255 lb (115.7 kg)  06/30/19 260 lb (117.9 kg)  05/16/19 259 lb (117.5 kg)     Ht Readings from Last 3 Encounters:  07/11/19 6' (1.829 m)  06/30/19 6' (1.829 m)  05/16/19 6' (1.829 m)    He is fully vaccinated.    General: The patient is awake, alert and appears not in acute distress. The patient is well groomed. Facial hair.  Head: Normocephalic, atraumatic.  Neck is supple. Mallampati  3 plus- very narrow,  neck circumference: 19.5  inches . Nasal airflow patent.  Retrognathia is seen.  Dental status:  Cardiovascular:  Regular rate and cardiac rhythm by pulse,  without distended neck veins. Respiratory: Lungs are clear to auscultation.  Skin:  Without evidence of ankle edema, or rash. Trunk: The patient's posture is erect.    Neurologic exam : The patient is awake and alert, oriented to place and time.   Memory subjective described as intact.  Attention span & concentration ability appears normal.  Speech is fluent,  without  dysarthria, dysphonia or aphasia.  Mood and affect are appropriate.   Cranial nerves: no loss of smell or taste reported  Pupils are equal and briskly reactive to light. Funduscopic exam deferred,  Extraocular movements in vertical and horizontal planes were intact and without nystagmus. No Diplopia. Visual fields by finger perimetry are intact. Hearing was reduced- limited.    Hearing aids are in place . facial sensation intact to fine touch.  Facial motor strength is symmetric and tongue and uvula move midline.  Neck ROM : rotation, tilt and flexion extension were normal for age and shoulder shrug was symmetrical.    Motor exam:  Symmetric bulk, tone and ROM.    The patient has arthritic changes to his hand including his dominant right hand from previous injuries.  He does not report any change in penmanship or sensation to the fingertips. Normal tone without cog wheeling, symmetric grip strength .   Sensory:  Fine touch,and vibration were normal.  He reports numbness and had felt a burning pain in the past, for 2-3 years.  Proprioception tested in the upper extremities was normal.   Coordination: Rapid alternating movements in the fingers/hands were of normal speed.  The Finger-to-nose maneuver was intact without evidence of ataxia, dysmetria or tremor.   Gait and station: Patient could rise unassisted from a seated position, he braced himself.  Toe and heel walk were deferred.  Deep tendon reflexes: in the  upper and lower extremities are symmetrically attenuated-  trace only .  Babinski response was deferred.      Dr. Duanne Guess had discussed reduction of sodium intake to also reduce any kind of swelling to the ankles, she obtained a depression score which was negative, the patient  takes Wellbutrin partially to give him energy.  An SSRI is also prescribed and seems to work.  He has a history of peripheral vascular disease with claudication nonneurologic, some burning and numbness in his feet has been attributed to a diabetic neuropathy.  At this time he is not majorly depressed but he has had endorsed a higher level of sleepiness on average.  Also he is fully vaccinated at this point he is adhering to the mask requirements.  I would very much like to invite him for a sleep study to check out if he still has obstructive sleep apnea my concern here is that his body mass index at this time is under 35 but his upper airway and not to be a sat of the patient very much predisposed to obstructive apnea.  There is also still a large neck.  This may never change with weight loss alone.  Also skeletal abnormalities include retrognathia and a very crowded dentition.  He has had restricted nasal airflow in the past but today airflow is patent and he does use Nasacort to maintain it this way.  His obstructive sleep apnea was diagnosed before he underwent bariatric surgery in 2005 and was never rechecked after he lost his weight.  There is a possibility that his apnea never resolved completely.   After spending a total time of 40  minutes face to face and additional time for physical and neurologic examination, review of laboratory studies,  personal review of imaging studies, reports and results of other testing and review of referral information / records as far as provided in visit, I have established the following Plan to proceed with:  1) attended sleep study preferred, we can order a SPLIT for this patient who is fully vaccinated. 2) HST will be preferred by patient,    I would like to thank Lewis Moccasin, MD and Hipp, Onalee Hua, Md [address] [place],  [place] [address] for allowing me to meet with and to take care of this pleasant patient. Fully vaccinated formerly morbidly obese CPAP user  , who underwent bariatric surgery.  Never had been rechecked if apnea resolved.  I plan to follow up either personally or through our NP within 2-3  month.   CC: I will share my notes with PCP   Electronically signed by: Melvyn Novas, MD 07/11/2019 1:13 PM  [facility] Board certified by The [employer] of Sleep Medicine and Diplomate of the [employer] of Sleep Medicine. Board certified In Neurology through the ABPN, Fellow of the [employer] of Neurology. Medical Director of [facility].

## 2019-07-11 NOTE — Patient Instructions (Signed)
Screening for Sleep Apnea  Sleep apnea is a condition in which breathing pauses or becomes shallow during sleep. Sleep apnea screening is a test to determine if you are at risk for sleep apnea. The test is easy and only takes a few minutes. Your health care provider may ask you to have this test in preparation for surgery or as part of a physical exam. What are the symptoms of sleep apnea? Common symptoms of sleep apnea include:  Snoring.  Restless sleep.  Daytime sleepiness.  Pauses in breathing.  Choking during sleep.  Irritability.  Forgetfulness.  Trouble thinking clearly.  Depression.  Personality changes. Most people with sleep apnea are not aware that they have it. Why should I get screened? Getting screened for sleep apnea can help:  Ensure your safety. It is important for your health care providers to know whether or not you have sleep apnea, especially if you are having surgery or have other long-term (chronic) health conditions.  Improve your health and allow you to get a better night's rest. Restful sleep can help you: ? Have more energy. ? Lose weight. ? Improve high blood pressure. ? Improve diabetes management. ? Prevent stroke. ? Prevent car accidents. How is screening done? Screening usually includes being asked a list of questions about your sleep quality. Some questions you may be asked include:  Do you snore?  Is your sleep restless?  Do you have daytime sleepiness?  Has a partner or spouse told you that you stop breathing during sleep?  Have you had trouble concentrating or memory loss? If your screening test is positive, you are at risk for the condition. Further testing may be needed to confirm a diagnosis of sleep apnea. Where to find more information You can find screening tools online or at your health care clinic. For more information about sleep apnea screening and healthy sleep, visit these websites:  Centers for Disease Control and  Prevention: www.cdc.gov/sleep/index.html  American Sleep Apnea Association: www.sleepapnea.org Contact a health care provider if:  You think that you may have sleep apnea. Summary  Sleep apnea screening can help determine if you are at risk for sleep apnea.  It is important for your health care providers to know whether or not you have sleep apnea, especially if you are having surgery or have other chronic health conditions.  You may be asked to take a screening test for sleep apnea in preparation for surgery or as part of a physical exam. This information is not intended to replace advice given to you by your health care provider. Make sure you discuss any questions you have with your health care provider. Document Revised: 10/29/2017 Document Reviewed: 04/24/2016 Elsevier Patient Education  2020 Elsevier Inc. Quality Sleep Information, Adult Quality sleep is important for your mental and physical health. It also improves your quality of life. Quality sleep means you:  Are asleep for most of the time you are in bed.  Fall asleep within 30 minutes.  Wake up no more than once a night.  Are awake for no longer than 20 minutes if you do wake up during the night. Most adults need 7-8 hours of quality sleep each night. How can poor sleep affect me? If you do not get enough quality sleep, you may have:  Mood swings.  Daytime sleepiness.  Confusion.  Decreased reaction time.  Sleep disorders, such as insomnia and sleep apnea.  Difficulty with: ? Solving problems. ? Coping with stress. ? Paying attention. These issues may   affect your performance and productivity at work, school, and at home. Lack of sleep may also put you at higher risk for accidents, suicide, and risky behaviors. If you do not get quality sleep you may also be at higher risk for several health problems, including:  Infections.  Type 2 diabetes.  Heart disease.  High blood  pressure.  Obesity.  Worsening of long-term conditions, like arthritis, kidney disease, depression, Parkinson's disease, and epilepsy. What actions can I take to get more quality sleep?      Stick to a sleep schedule. Go to sleep and wake up at about the same time each day. Do not try to sleep less on weekdays and make up for lost sleep on weekends. This does not work.  Try to get about 30 minutes of exercise on most days. Do not exercise 2-3 hours before going to bed.  Limit naps during the day to 30 minutes or less.  Do not use any products that contain nicotine or tobacco, such as cigarettes or e-cigarettes. If you need help quitting, ask your health care provider.  Do not drink caffeinated beverages for at least 8 hours before going to bed. Coffee, tea, and some sodas contain caffeine.  Do not drink alcohol close to bedtime.  Do not eat large meals close to bedtime.  Do not take naps in the late afternoon.  Try to get at least 30 minutes of sunlight every day. Morning sunlight is best.  Make time to relax before bed. Reading, listening to music, or taking a hot bath promotes quality sleep.  Make your bedroom a place that promotes quality sleep. Keep your bedroom dark, quiet, and at a comfortable room temperature. Make sure your bed is comfortable. Take out sleep distractions like TV, a computer, smartphone, and bright lights.  If you are lying awake in bed for longer than 20 minutes, get up and do a relaxing activity until you feel sleepy.  Work with your health care provider to treat medical conditions that may affect sleeping, such as: ? Nasal obstruction. ? Snoring. ? Sleep apnea and other sleep disorders.  Talk to your health care provider if you think any of your prescription medicines may cause you to have difficulty falling or staying asleep.  If you have sleep problems, talk with a sleep consultant. If you think you have a sleep disorder, talk with your health  care provider about getting evaluated by a specialist. Where to find more information  National Sleep Foundation website: https://sleepfoundation.org  National Heart, Lung, and Blood Institute (NHLBI): www.nhlbi.nih.gov/files/docs/public/sleep/healthy_sleep.pdf  Centers for Disease Control and Prevention (CDC): www.cdc.gov/sleep/index.html Contact a health care provider if you:  Have trouble getting to sleep or staying asleep.  Often wake up very early in the morning and cannot get back to sleep.  Have daytime sleepiness.  Have daytime sleep attacks of suddenly falling asleep and sudden muscle weakness (narcolepsy).  Have a tingling sensation in your legs with a strong urge to move your legs (restless legs syndrome).  Stop breathing briefly during sleep (sleep apnea).  Think you have a sleep disorder or are taking a medicine that is affecting your quality of sleep. Summary  Most adults need 7-8 hours of quality sleep each night.  Getting enough quality sleep is an important part of health and well-being.  Make your bedroom a place that promotes quality sleep and avoid things that may cause you to have poor sleep, such as alcohol, caffeine, smoking, and large meals.  Talk to   your health care provider if you have trouble falling asleep or staying asleep. This information is not intended to replace advice given to you by your health care provider. Make sure you discuss any questions you have with your health care provider. Document Revised: 04/21/2017 Document Reviewed: 04/21/2017 Elsevier Patient Education  2020 Elsevier Inc.  

## 2019-07-26 ENCOUNTER — Telehealth: Payer: Self-pay

## 2019-07-26 NOTE — Telephone Encounter (Signed)
LVM for pt to call me back to schedule sleep study  

## 2019-08-22 ENCOUNTER — Ambulatory Visit: Payer: Medicare PPO | Admitting: Cardiology

## 2019-08-22 ENCOUNTER — Other Ambulatory Visit: Payer: Self-pay

## 2019-08-22 ENCOUNTER — Encounter: Payer: Self-pay | Admitting: Cardiology

## 2019-08-22 VITALS — BP 120/76 | HR 80 | Ht 72.0 in | Wt 253.0 lb

## 2019-08-22 DIAGNOSIS — E782 Mixed hyperlipidemia: Secondary | ICD-10-CM | POA: Diagnosis not present

## 2019-08-22 DIAGNOSIS — E118 Type 2 diabetes mellitus with unspecified complications: Secondary | ICD-10-CM | POA: Diagnosis not present

## 2019-08-22 DIAGNOSIS — E669 Obesity, unspecified: Secondary | ICD-10-CM

## 2019-08-22 NOTE — Progress Notes (Signed)
Cardiology Office Note:    Date:  08/22/2019   ID:  Cody Hoover, DOB 05-07-1955, MRN [id number]  PCP:  Cody Moccasin, MD  Cardiologist:  Cody Ripple, DO  Electrophysiologist:  None   Referring MD: Cody Abraham, MD   Chief Complaint  Patient presents with  . Follow-up    History of Present Illness:    Cody Hoover is a 64 y.o. male with a hx of diabetes, hyperlipidemia presents today for follow-up visit.  Last saw the patient on May 16, 2019 due to an abnormal ABI.  Tell me that he had experienced some leg pain.  I started the patient on atorvastatin which was added to his already taking Plavix.  Also referred the patient to vascular surgery.  In the interim he was able to see vascular surgery at this time conservative management.  Here today for follow-up visit he does not offer any complaints.  Tells me his leg swelling has improved tremendously.  Eyes any chest pain, shortness of breath, lightheadedness or dizziness.   Past Medical History:  Diagnosis Date  . Anxiety   . Diabetes mellitus without complication (HCC)   . GERD (gastroesophageal reflux disease)   . Hyperlipidemia   . Ulcer, gastric, acute     Past Surgical History:  Procedure Laterality Date  . GASTRIC BYPASS  2005  . SHOULDER SURGERY      Current Medications: Current Meds  Medication Sig  . atorvastatin (LIPITOR) 20 MG tablet Take 1 tablet (20 mg total) by mouth daily.  Marland Kitchen azelastine (ASTELIN) 0.1 % nasal spray Place 2 sprays into both nostrils 2 (two) times daily. Use in each nostril as directed  . B Complex Vitamins (B-COMPLEX/B-12 SL) Place under the tongue.  Marland Kitchen buPROPion (WELLBUTRIN XL) 300 MG 24 hr tablet   . cholecalciferol (VITAMIN D3) 25 MCG (1000 UNIT) tablet Take 1,000 Units by mouth daily.  . clopidogrel (PLAVIX) 75 MG tablet   . Magnesium 70 MG CAPS Take by mouth.  . metFORMIN (GLUCOPHAGE) 500 MG tablet 500 mg in the morning and at bedtime.  . sertraline (ZOLOFT) 100 MG tablet     . vitamin B-12 (CYANOCOBALAMIN) 500 MCG tablet Take 500 mcg by mouth daily.     Allergies:   Patient has no known allergies.   Social History   Socioeconomic History  . Marital status: Divorced    Spouse name: Not on file  . Number of children: Not on file  . Years of education: Not on file  . Highest education level: Not on file  Occupational History  . Not on file  Tobacco Use  . Smoking status: Former Smoker    Types: Cigarettes    Quit date: 2013    Years since quitting: 8.5  . Smokeless tobacco: Never Used  Vaping Use  . Vaping Use: Never used  Substance and Sexual Activity  . Alcohol use: Yes    Comment: Beer once a month  . Drug use: No  . Sexual activity: Not on file  Other Topics Concern  . Not on file  Social History Narrative  . Not on file   Social Determinants of Health   Financial Resource Strain:   . Difficulty of Paying Living Expenses:   Food Insecurity:   . Worried About Programme researcher, broadcasting/film/video in the Last Year:   . Barista in the Last Year:   Transportation Needs:   . Freight forwarder (Medical):   Marland Kitchen Lack of Transportation (Non-Medical):  Physical Activity:   . Days of Exercise per Week:   . Minutes of Exercise per Session:   Stress:   . Feeling of Stress :   Social Connections:   . Frequency of Communication with Friends and Family:   . Frequency of Social Gatherings with Friends and Family:   . Attends Religious Services:   . Active Member of Clubs or Organizations:   . Attends BankerClub or Organization Meetings:   Marland Kitchen. Marital Status:      Family History: The patient's family history includes Asthma in his paternal grandmother; Bone cancer in his paternal grandfather; Brain cancer in his sister; Diabetes in his sister; Heart attack in his father; Pancreatic cancer in his mother.  ROS:   Review of Systems  Constitution: Negative for decreased appetite, fever and weight gain.  HENT: Negative for congestion, ear discharge, hoarse  voice and sore throat.   Eyes: Negative for discharge, redness, vision loss in right eye and visual halos.  Cardiovascular: Negative for chest pain, dyspnea on exertion, leg swelling, orthopnea and palpitations.  Respiratory: Negative for cough, hemoptysis, shortness of breath and snoring.   Endocrine: Negative for heat intolerance and polyphagia.  Hematologic/Lymphatic: Negative for bleeding problem. Does not bruise/bleed easily.  Skin: Negative for flushing, nail changes, rash and suspicious lesions.  Musculoskeletal: Negative for arthritis, joint pain, muscle cramps, myalgias, neck pain and stiffness.  Gastrointestinal: Negative for abdominal pain, bowel incontinence, diarrhea and excessive appetite.  Genitourinary: Negative for decreased libido, genital sores and incomplete emptying.  Neurological: Negative for brief paralysis, focal weakness, headaches and loss of balance.  Psychiatric/Behavioral: Negative for altered mental status, depression and suicidal ideas.  Allergic/Immunologic: Negative for HIV exposure and persistent infections.    EKGs/Labs/Other Studies Reviewed:    The following studies were reviewed today:   EKG: None today  TTE  IMPRESSIONS  1. Left ventricular ejection fraction, by estimation, is 60 to 65%. The left ventricle has normal function. The left ventricle has no regional wall motion abnormalities. There is mild concentric left ventricular hypertrophy. Left ventricular diastolic parameters were normal.  2. Right ventricular systolic function is normal. The right ventricular size is normal. There is normal pulmonary artery systolic pressure.  3. The mitral valve is normal in structure. No evidence of mitral valve regurgitation. No evidence of mitral stenosis.  4. The aortic valve is normal in structure. Aortic valve regurgitation is not visualized. No aortic stenosis is present.  5. The inferior vena cava is normal in size with greater than 50% respiratory  variability, suggesting right atrial pressure of 3 mmHg.    VASCULAR arterial  duplex Summary: Abdominal Aorta: The largest aortic measurement is 3.0 cm.  IVC/Iliac: No evidence of thrombus in IVC and Iliac veins.      Recent Labs: No results found for requested labs within last 8760 hours.  Recent Lipid Panel No results found for: CHOL, TRIG, HDL, CHOLHDL, VLDL, LDLCALC, LDLDIRECT  Physical Exam:    VS:  BP 120/76 (BP Location: Left Arm, Patient Position: Sitting, Cuff Size: Normal)   Pulse 80   Ht 6' (1.829 m)   Wt (!) 253 lb (114.8 kg)   SpO2 96%   BMI 34.31 kg/m     Wt Readings from Last 3 Encounters:  08/22/19 (!) 253 lb (114.8 kg)  07/11/19 255 lb (115.7 kg)  06/30/19 260 lb (117.9 kg)     GEN: Well nourished, well developed in no acute distress HEENT: Normal NECK: No JVD; No carotid bruits LYMPHATICS:  No lymphadenopathy CARDIAC: S1S2 noted,RRR, no murmurs, rubs, gallops RESPIRATORY:  Clear to auscultation without rales, wheezing or rhonchi  ABDOMEN: Soft, non-tender, non-distended, +bowel sounds, no guarding. EXTREMITIES: No edema, No cyanosis, no clubbing MUSCULOSKELETAL:  No deformity  SKIN: Warm and dry NEUROLOGIC:  Alert and oriented x 3, non-focal PSYCHIATRIC:  Normal affect, good insight  ASSESSMENT:    1. Type 2 diabetes mellitus with complication, without long-term current use of insulin (HCC)   2. Mixed hyperlipidemia   3. Obesity (BMI 30-39.9)    PLAN:    Cardiovascular standpoint he appears to be doing well.  He has no symptoms.  We will continue to monitor.  I would not be changing on her medication regimen.  Obesity-the patient understands the need to lose weight with diet and exercise. We have discussed specific strategies for this.  The patient is in agreement with the above plan. The patient left the office in stable condition.  The patient will follow up in 1 year or sooner if needed.   Medication Adjustments/Labs and Tests  Ordered: Current medicines are reviewed at length with the patient today.  Concerns regarding medicines are outlined above.  No orders of the defined types were placed in this encounter.  No orders of the defined types were placed in this encounter.   Patient Instructions  Medication Instructions:  Your physician recommends that you continue on your current medications as directed. Please refer to the Current Medication list given to you today.  *If you need a refill on your cardiac medications before your next appointment, please call your pharmacy*   Lab Work: None.  If you have labs (blood work) drawn today and your tests are completely normal, you will receive your results only by: Marland Kitchen MyChart Message (if you have MyChart) OR . A paper copy in the mail If you have any lab test that is abnormal or we need to change your treatment, we will call you to review the results.   Testing/Procedures: None.    Follow-Up: At [facility], you and your health needs are our priority.  As part of our continuing mission to provide you with exceptional heart care, we have created designated Provider Care Teams.  These Care Teams include your primary Cardiologist (physician) and Advanced Practice Providers (APPs -  Physician Assistants and Nurse Practitioners) who all work together to provide you with the care you need, when you need it.  We recommend signing up for the patient portal called "MyChart".  Sign up information is provided on this After Visit Summary.  MyChart is used to connect with patients for Virtual Visits (Telemedicine).  Patients are able to view lab/test results, encounter notes, upcoming appointments, etc.  Non-urgent messages can be sent to your provider as well.   To learn more about what you can do with MyChart, go to [email or web address].    Your next appointment:   1 year(s)  The format for your next appointment:   In Person  Provider:   Thomasene Ripple,  DO   Other Instructions      Adopting a Healthy Lifestyle.  Know what a healthy weight is for you (roughly BMI <25) and aim to maintain this   Aim for 7+ servings of fruits and vegetables daily   65-80+ fluid ounces of water or unsweet tea for healthy kidneys   Limit to max 1 drink of alcohol per day; avoid smoking/tobacco   Limit animal fats in diet for cholesterol and heart health - choose grass  fed whenever available   Avoid highly processed foods, and foods high in saturated/trans fats   Aim for low stress - take time to unwind and care for your mental health   Aim for 150 min of moderate intensity exercise weekly for heart health, and weights twice weekly for bone health   Aim for 7-9 hours of sleep daily   When it comes to diets, agreement about the perfect plan isnt easy to find, even among the experts. Experts at the [facility] [employer] developed an idea known as the Healthy Eating Plate. Just imagine a plate divided into logical, healthy portions.   The emphasis is on diet quality:   Load up on vegetables and fruits - one-half of your plate: Aim for color and variety, and remember that potatoes dont count.   Go for whole grains - one-quarter of your plate: Whole wheat, barley, wheat berries, quinoa, oats, brown rice, and foods made with them. If you want pasta, go with whole wheat pasta.   Protein power - one-quarter of your plate: Fish, chicken, beans, and nuts are all healthy, versatile protein sources. Limit red meat.   The diet, however, does go beyond the plate, offering a few other suggestions.   Use healthy plant oils, such as olive, canola, soy, corn, sunflower and peanut. Check the labels, and avoid partially hydrogenated oil, which have unhealthy trans fats.   If youre thirsty, drink water. Coffee and tea are good in moderation, but skip sugary drinks and limit milk and dairy products to one or two daily servings.   The type of  carbohydrate in the diet is more important than the amount. Some sources of carbohydrates, such as vegetables, fruits, whole grains, and beans-are healthier than others.   Finally, stay active  Signed, Cody Ripple, DO  08/22/2019 1:35 PM    [facility]

## 2019-08-22 NOTE — Patient Instructions (Signed)

## 2019-08-24 DIAGNOSIS — G4700 Insomnia, unspecified: Secondary | ICD-10-CM | POA: Diagnosis not present

## 2019-08-24 DIAGNOSIS — F411 Generalized anxiety disorder: Secondary | ICD-10-CM | POA: Diagnosis not present

## 2019-08-24 DIAGNOSIS — I739 Peripheral vascular disease, unspecified: Secondary | ICD-10-CM | POA: Diagnosis not present

## 2019-08-24 DIAGNOSIS — E669 Obesity, unspecified: Secondary | ICD-10-CM | POA: Diagnosis not present

## 2019-08-24 DIAGNOSIS — F331 Major depressive disorder, recurrent, moderate: Secondary | ICD-10-CM | POA: Diagnosis not present

## 2019-09-04 ENCOUNTER — Ambulatory Visit (INDEPENDENT_AMBULATORY_CARE_PROVIDER_SITE_OTHER): Payer: Medicare PPO | Admitting: Neurology

## 2019-09-04 DIAGNOSIS — G4733 Obstructive sleep apnea (adult) (pediatric): Secondary | ICD-10-CM

## 2019-09-04 DIAGNOSIS — E1151 Type 2 diabetes mellitus with diabetic peripheral angiopathy without gangrene: Secondary | ICD-10-CM

## 2019-09-04 DIAGNOSIS — G478 Other sleep disorders: Secondary | ICD-10-CM

## 2019-09-04 DIAGNOSIS — Z9884 Bariatric surgery status: Secondary | ICD-10-CM

## 2019-09-04 DIAGNOSIS — M2619 Other specified anomalies of jaw-cranial base relationship: Secondary | ICD-10-CM

## 2019-09-04 DIAGNOSIS — G4719 Other hypersomnia: Secondary | ICD-10-CM

## 2019-09-11 DIAGNOSIS — G478 Other sleep disorders: Secondary | ICD-10-CM | POA: Insufficient documentation

## 2019-09-11 DIAGNOSIS — Z9884 Bariatric surgery status: Secondary | ICD-10-CM | POA: Insufficient documentation

## 2019-09-11 DIAGNOSIS — M2619 Other specified anomalies of jaw-cranial base relationship: Secondary | ICD-10-CM | POA: Insufficient documentation

## 2019-09-11 DIAGNOSIS — G4719 Other hypersomnia: Secondary | ICD-10-CM | POA: Insufficient documentation

## 2019-09-11 DIAGNOSIS — E1151 Type 2 diabetes mellitus with diabetic peripheral angiopathy without gangrene: Secondary | ICD-10-CM | POA: Insufficient documentation

## 2019-09-11 NOTE — Progress Notes (Signed)
Cc Dr Duanne Guess, Summary & Diagnosis:   Moderate severe sleep apnea at an AHI of 24.8/h without REM sleep  accentuation and without prolonged hypoxemia.   Recommendations:    This degree of apnea can be treated by CPAP, by dental device and  by inspire procedure. I will offer an autotitration CPAP device  for this patient unless he is opposed.   The settings will be CPAP 6 -16 cm water, 2 cm EPR, heated  humidification and mask of choice.   Interpreting Physician: Melvyn Novas, MD

## 2019-09-11 NOTE — Addendum Note (Signed)
Addended by: Melvyn Novas on: 09/11/2019 06:01 PM   Modules accepted: Orders

## 2019-09-11 NOTE — Procedures (Signed)
Patient Information     First Name: Cody Last Name: Hoover ID: [id number]  Birth Date: [birth year] Age:  Gender: Male  Referring Provider: Onalee Hua Hipp,MD BMI: 34.6 (W=256 lb, H=6' 0'')  Neck Circ.:  19 '' Epworth:  13   Sleep Study Information    Study Date: 09/04/19 S/H/A Version: [id number] / [id number] / 77    History:    [name] is a 64 year -old Caucasian male patient, status post bariatric surgery and 100 pound weight loss, and seen upon a referral on 07/11/2019 from Dr. Duanne Guess, local PCP, and from Dr. Crista Elliot at [facility].     Chief concern according to patient :  " not sleeping as much, and not the best quality to my sleep" - the patient discovered mold in his bedroom and has slept in another room - on an air mattress, and the room is not being well ventilated. He denies snoring, lives alone. He is unaware if he snores or still has apnea.     Summary & Diagnosis:    Moderate severe sleep apnea at an AHI of 24.8/h without REM sleep accentuation and without prolonged hypoxemia.   Recommendations:     This degree of apnea can be treated by CPAP, by dental device and by inspire procedure. I will offer an autotitration CPAP device for this patient unless he  is opposed.   The settings will be CPAP 6 -16 cm water, 2 cm EPR, heated humidification and mask of choice.   Interpreting Physician: Melvyn Novas, MD           Sleep Summary  Oxygen Saturation Statistics   Start Study Time: End Study Time: Total Recording Time:  8:54:45 PM 3:41:03 AM 6 hrs,  Total Sleep Time % REM of Sleep Time:  5 hrs, 1 min 19.8    Mean: 93 Minimum: 69 Maximum: 99  Mean of Desaturations Nadirs (%):   89  Oxygen Desatur. %:   4-9 10-20 >20 Total  Events Number Total    68  5 90.7 6.7  2 2.7  75 100.0  Oxygen Saturation: <90 <=88 <85 <80 <70  Duration (minutes): Sleep % 9.2 3.0  6.9 2.0  2.3 0.7 1.0 0.3 0.0 0.0     Respiratory Indices      Total Events REM  NREM All Night  pRDI:  124 pAHI:  124 ODI:  75  pAHIc:  22  % CSR: 0.0 20.2 20.2 14.1 5.4 25.9 25.9 15.2 4.4 24.8 24.8 15.0 4.6       Pulse Rate Statistics during Sleep (BPM)      Mean: 66 Minimum: 54 Maximum:  78    Indices are calculated using technically valid sleep time of  5 hrs, 0 min. Central-Indices are calculated using technically valid sleep time of  4  hrs, 48 min. pRDI/pAHI are calculated using oxi desaturations ? 3%  Body Position Statistics  Position Supine Prone Right Left Non-Supine  Sleep (min) 301.3 0.0 0.0 0.0 0.0  Sleep % 100.0 0.0 0.0 0.0 0.0  pRDI 24.8 N/A N/A N/A N/A  pAHI 24.8 N/A N/A N/A N/A  ODI 15.0 N/A N/A N/A N/A     Snoring Statistics Snoring Level (dB) >40 >50 >60 >70 >80 >Threshold (45)  Sleep (min) 96.3 1.4 0.5 0.0 0.0 3.6  Sleep % 32.0 0.5 0.2 0.0 0.0 1.2    Mean: 41 dB Sleep Stages Chart  pAHI=24.8                                                  Mild              Moderate                    Severe                                                 5              15                    30  * Reference values are given by physician

## 2019-09-12 ENCOUNTER — Encounter: Payer: Self-pay | Admitting: Neurology

## 2019-09-12 ENCOUNTER — Telehealth: Payer: Self-pay | Admitting: Neurology

## 2019-09-12 NOTE — Telephone Encounter (Signed)
-----   Message from Melvyn Novas, MD sent at 09/11/2019  6:01 PM EDT ----- Cc Dr Duanne Guess, Summary & Diagnosis:   Moderate severe sleep apnea at an AHI of 24.8/h without REM sleep  accentuation and without prolonged hypoxemia.   Recommendations:    This degree of apnea can be treated by CPAP, by dental device and  by inspire procedure. I will offer an autotitration CPAP device  for this patient unless he is opposed.   The settings will be CPAP 6 -16 cm water, 2 cm EPR, heated  humidification and mask of choice.   Interpreting Physician: Melvyn Novas, MD

## 2019-09-12 NOTE — Telephone Encounter (Signed)
Called patient to discuss sleep study results. No answer at this time. LVM for the patient to call back.   

## 2019-09-12 NOTE — Telephone Encounter (Signed)
Pt returned call. I advised pt that Dr. Vickey Huger reviewed their sleep study results and found that pt has moderate sleep apnea. Dr. Vickey Huger recommends that pt should start auto CPAP. I reviewed PAP compliance expectations with the pt. Pt is agreeable to starting a CPAP. I advised pt that an order will be sent to a DME, [facility] and [facility] will call the pt within about one week after they file with the pt's insurance. [facility]) will show the pt how to use the machine, fit for masks, and troubleshoot the CPAP if needed. A follow up appt was made for insurance purposes with Dr. Vickey Huger on nov 1,2021 at 9:30 am. Pt verbalized understanding to arrive 15 minutes early and bring their CPAP. A letter with all of this information in it will be mailed to the pt as a reminder. I verified with the pt that the address we have on file is correct. Pt verbalized understanding of results. Pt had no questions at this time but was encouraged to call back if questions arise. I have sent the order to [facility]) and have received confirmation that they have received the order.

## 2019-09-28 DIAGNOSIS — F331 Major depressive disorder, recurrent, moderate: Secondary | ICD-10-CM | POA: Diagnosis not present

## 2019-09-28 DIAGNOSIS — F411 Generalized anxiety disorder: Secondary | ICD-10-CM | POA: Diagnosis not present

## 2019-09-28 DIAGNOSIS — E1165 Type 2 diabetes mellitus with hyperglycemia: Secondary | ICD-10-CM | POA: Diagnosis not present

## 2019-10-05 ENCOUNTER — Telehealth: Payer: Self-pay | Admitting: Neurology

## 2019-10-05 DIAGNOSIS — F331 Major depressive disorder, recurrent, moderate: Secondary | ICD-10-CM | POA: Diagnosis not present

## 2019-10-05 DIAGNOSIS — G4733 Obstructive sleep apnea (adult) (pediatric): Secondary | ICD-10-CM | POA: Diagnosis not present

## 2019-10-05 DIAGNOSIS — E782 Mixed hyperlipidemia: Secondary | ICD-10-CM | POA: Diagnosis not present

## 2019-10-05 DIAGNOSIS — G4700 Insomnia, unspecified: Secondary | ICD-10-CM | POA: Diagnosis not present

## 2019-10-05 DIAGNOSIS — F411 Generalized anxiety disorder: Secondary | ICD-10-CM | POA: Diagnosis not present

## 2019-10-05 DIAGNOSIS — E1165 Type 2 diabetes mellitus with hyperglycemia: Secondary | ICD-10-CM | POA: Diagnosis not present

## 2019-10-05 NOTE — Telephone Encounter (Signed)
Called the MD office back. There was no answer and on lunch. Left a message advising that order has been sent to the company and they are working on getting it processed to get him set up. Gave [facility]'s number in case they are wanting to contact DME company but assured that I had already reached out and contacted the manager of [employer] and they are working on it. Advised them to call back if needed

## 2019-10-05 NOTE — Telephone Encounter (Signed)
Dr. Maryelizabeth Rowan [facility]) called would like an update on Pt's CPAP machine. Pt has not received CPAP machine.  Would like a call from the nurse.

## 2019-11-27 ENCOUNTER — Ambulatory Visit: Payer: Self-pay | Admitting: Neurology

## 2019-12-22 DIAGNOSIS — G4733 Obstructive sleep apnea (adult) (pediatric): Secondary | ICD-10-CM | POA: Diagnosis not present

## 2020-01-04 DIAGNOSIS — E1165 Type 2 diabetes mellitus with hyperglycemia: Secondary | ICD-10-CM | POA: Diagnosis not present

## 2020-01-04 DIAGNOSIS — E782 Mixed hyperlipidemia: Secondary | ICD-10-CM | POA: Diagnosis not present

## 2020-01-08 DIAGNOSIS — J309 Allergic rhinitis, unspecified: Secondary | ICD-10-CM | POA: Diagnosis not present

## 2020-01-08 DIAGNOSIS — E782 Mixed hyperlipidemia: Secondary | ICD-10-CM | POA: Diagnosis not present

## 2020-01-08 DIAGNOSIS — E1165 Type 2 diabetes mellitus with hyperglycemia: Secondary | ICD-10-CM | POA: Diagnosis not present

## 2020-01-09 DIAGNOSIS — G4733 Obstructive sleep apnea (adult) (pediatric): Secondary | ICD-10-CM | POA: Diagnosis not present

## 2020-01-10 ENCOUNTER — Ambulatory Visit: Payer: Self-pay | Admitting: Neurology

## 2020-01-21 DIAGNOSIS — G4733 Obstructive sleep apnea (adult) (pediatric): Secondary | ICD-10-CM | POA: Diagnosis not present

## 2020-02-21 ENCOUNTER — Encounter: Payer: Self-pay | Admitting: Neurology

## 2020-02-21 ENCOUNTER — Ambulatory Visit: Payer: Medicare PPO | Admitting: Neurology

## 2020-02-21 VITALS — BP 116/71 | HR 71 | Ht 72.0 in | Wt 253.0 lb

## 2020-02-21 DIAGNOSIS — G4733 Obstructive sleep apnea (adult) (pediatric): Secondary | ICD-10-CM

## 2020-02-21 DIAGNOSIS — Z9884 Bariatric surgery status: Secondary | ICD-10-CM

## 2020-02-21 DIAGNOSIS — Z91199 Patient's noncompliance with other medical treatment and regimen due to unspecified reason: Secondary | ICD-10-CM | POA: Insufficient documentation

## 2020-02-21 DIAGNOSIS — Z9989 Dependence on other enabling machines and devices: Secondary | ICD-10-CM | POA: Diagnosis not present

## 2020-02-21 DIAGNOSIS — G4719 Other hypersomnia: Secondary | ICD-10-CM | POA: Diagnosis not present

## 2020-02-21 DIAGNOSIS — E1151 Type 2 diabetes mellitus with diabetic peripheral angiopathy without gangrene: Secondary | ICD-10-CM | POA: Diagnosis not present

## 2020-02-21 DIAGNOSIS — Z9114 Patient's other noncompliance with medication regimen: Secondary | ICD-10-CM

## 2020-02-21 DIAGNOSIS — M2619 Other specified anomalies of jaw-cranial base relationship: Secondary | ICD-10-CM

## 2020-02-21 NOTE — Progress Notes (Signed)
SLEEP MEDICINE CLINIC    Provider:  Melvyn Novasarmen  Monterrio Gerst, MD  Primary Care Physician:  Lewis Moccasinewey, Elizabeth R, MD [address] [place] [id number]     Referring Provider: Lewis Moccasinewey, Elizabeth R, Md [address] [place] [id number]          Chief Complaint according to patient   Patient presents with:    . New Patient (Initial Visit)     Patient alone, room 10. Patient presents today for initial CPAP follow-up. Has not reached compliance at this time. Patient states there are times he falls asleep in forgets to put it on. He has a renovation happening at his house which has thrown him out of his routine of where he sleeps. Patient also has had sinus infections which have hindered him using the machine. Patient made aware of needing to meet compliance by 03/23/20 or insurance would make him restart the process. DME [facility]      RV 02-21-2020:  Cody Hoover is a 65 y.o. year old  Caucasian male patient , and seen in a RV after sleep study and being prescribed auto CPAP. I the interval he has had some sinus infection and vertigo, now resoled. Cody Hoover was originally seen here for a sleep consultation on 11 July 2019 and at the time referred by Dr. Duanne Guessewey.  He underwent a PSG-sleep study on 8-9 2021 , where had moderate apnea at an AHI of 24.8/h but there was no REM sleep component and there was no hypoxemia of clinical significance.   I recommended to try to treat this apnea degree with CPAP but he could also use a dental device for inspire procedure however his BMI now is 34 and the inspire procedure requires a BMI of 32 or less.   He decided to go with auto CPAP which was prescribed with a pressure window between 6 and 16 cmH2O to centimeter EPR and the patient states that he does not mind the CPAP he just falls asleep before he can put it on.  His habit of such that he comes home puts on the TV sits down and falls asleep by 9:00 is often fast asleep and he does not wake  up for several hours.  So apparently there is no nocturia he has used the machine only on 13 out of 60 days to a degree that was over 4 hours consecutively.  He has quite significant air leaks during that time.   The 95th percentile air leak is 34.6.  The 95th percentile pressure is 11.3 and his residual AHI was only 2.1.  From the pressure settings I think this works very well for him but we have to find a mask that will reduce the air leakage.   I also needed him to understand that he needs to use the machine every night for 4 hours or more in order to be compliant by the insurance standards.    Today's fatigue severity score was endorsed at 31 out of 63 points and the Epworth Sleepiness Scale at 10 out of 24 points this is a endorsement not considering how many daytime naps the patient may take.  He endorsed quite significant sleepiness if he is at rest not stimulated and not physically active and also after lunchtime.  He is using a nasal pillow and he likes it- he has facial hair that would likely interfere with a FFM seal.  I asked him to bring  the CPAP to where e is most likely to go to sleep, and if it is in his recliner, he should place the CPAP right there. He denies a dry mouth when using CPAP.      HISTORY OF PRESENT ILLNESS: He is status post bariatric surgery and 100 pound weight loss, and seen upon a referral on 02/21/2020 from Dr. Duanne Guess, local PCP, and from Dr. Crista Elliot at [facility].    Chief concern according to patient :  " not sleeping as much, and not the best quality to my sleep" - the patient disovered  mold in his home and has slept in another room - on a air mattress, and the room not being well ventilated. He denies snoring , lives alone.  He is unaware of any mold related health symptoms to him.   I have the pleasure of seeing Cody Hoover today, a right -handed Caucasian male with a possible sleep disorder.  he   has a past medical history of Anxiety, Diabetes  mellitus without complication (HCC), GERD (gastroesophageal reflux disease), Hyperlipidemia, and Ulcer, gastric, and underwent bariatric gastric bypass surgery in 2005- lost 100 pounds, reached 236 pounds. He weighted # 350 at his heaviest time.    The patient had the first sleep study in the year 2000, before bariatric surgery- and none to follow the procedure.   He used CPAP with difficulties, had nasal airflow issues, seasonal allergies.- he used CPAP for several years did not like it much.  He PS :  uses Nasocort now.    Sleep relevant medical history: Nocturia may be once - remote history of Night terror - never had a Tonsillectomy.  Family medical /sleep history:son is obese and on CPAP with OSA, insomnia, sleep walkers. son , Madelaine Bhat is 39 and has OSA.    Social history:  Patient is retired from Patent examiner - Transport planner, Veterinary surgeon, Therapist, occupational. He worked many years on night shifts.  He  lives in a household alone. Family status is widowed, with 2 sons, 74 and 85 years old - several grandchildren. Pets are present. 2 cats and a dog.  Tobacco use- quit 2013.   ETOH use : 2-3 / a week.   Caffeine intake in form of Coffee( 2 cups ). Regular exercise - restricted, he walks some.  Hobbies : home improvements    Sleep habits are as follows: The patient's dinner time is between 6-7 PM.  The patient goes to bed at 11-12 PM and continues to sleep for 4-6 hours. Unsure of h what wakes him up.  The preferred sleep position is supine , left side , with the support of 2 pillows. His bedroom has open , uncovered windows.   Dreams are reportedly rare.  7  AM is the usual rise time. The patient wakes up spontaneously, sometimes hours before.  He reports not feeling refreshed or restored in AM, with symptoms such as dry mouth and residual fatigue. Naps are taken very frequently, lasting from 20 to 60 minutes and are more refreshing than nocturnal sleep.    Review of Systems: Out of a  complete 14 system review, the patient complains of only the following symptoms, and all other reviewed systems are negative.:   Tinnitus -Fatigue, sleepiness , snoring, fragmented sleep, Insomnia- difficulties getting  enough sleep/ Some foot pain.  How likely are you to doze in the following situations: 0 = not likely, 1 = slight chance, 2 = moderate chance, 3 = high chance  Sitting and Reading? Watching Television? Sitting inactive in a public place (theater or meeting)? As a passenger in a car for an hour without a break? Lying down in the afternoon when circumstances permit? Sitting and talking to someone? Sitting quietly after lunch without alcohol? In a car, while stopped for a few minutes in traffic?   Today's fatigue severity score was endorsed at 31 out of 63 points and the Epworth Sleepiness Scale at 10 out of 24 points this is a endorsement not considering how many daytime naps the patient may take.    He endorsed quite significant sleepiness if he is at rest, not stimulated and not physically active, and also after lunchtime.   Social History   Socioeconomic History  . Marital status: Divorced    Spouse name: Not on file  . Number of children: Not on file  . Years of education: Not on file  . Highest education level: Not on file  Occupational History  . Not on file  Tobacco Use  . Smoking status: Former Smoker    Types: Cigarettes    Quit date: 2013    Years since quitting: 9.0  . Smokeless tobacco: Never Used  Vaping Use  . Vaping Use: Never used  Substance and Sexual Activity  . Alcohol use: Yes    Comment: Beer once a month  . Drug use: No  . Sexual activity: Not on file  Other Topics Concern  . Not on file  Social History Narrative  . Not on file   Social Determinants of Health   Financial Resource Strain: Not on file  Food Insecurity: Not on file  Transportation Needs: Not on file  Physical Activity: Not on file  Stress: Not on file  Social  Connections: Not on file    Family History  Problem Relation Age of Onset  . Pancreatic cancer Mother   . Heart attack Father   . Diabetes Sister   . Asthma Paternal Grandmother   . Bone cancer Paternal Grandfather   . Brain cancer Sister     Past Medical History:  Diagnosis Date  . Anxiety   . Diabetes mellitus without complication (HCC)   . GERD (gastroesophageal reflux disease)   . Hyperlipidemia   . Ulcer, gastric, acute     Past Surgical History:  Procedure Laterality Date  . GASTRIC BYPASS  2005  . SHOULDER SURGERY       Current Outpatient Medications on File Prior to Visit  Medication Sig Dispense Refill  . montelukast (SINGULAIR) 10 MG tablet Take 10 mg by mouth at bedtime.    Marland Kitchen atorvastatin (LIPITOR) 20 MG tablet Take 1 tablet (20 mg total) by mouth daily. 90 tablet 3  . azelastine (ASTELIN) 0.1 % nasal spray Place 2 sprays into both nostrils 2 (two) times daily. Use in each nostril as directed    . B Complex Vitamins (B-COMPLEX/B-12 SL) Place under the tongue.    Marland Kitchen buPROPion (WELLBUTRIN XL) 300 MG 24 hr tablet     . cholecalciferol (VITAMIN D3) 25 MCG (1000 UNIT) tablet Take 1,000 Units by mouth daily.    . clopidogrel (PLAVIX) 75 MG tablet     . Magnesium 70 MG CAPS Take by mouth.    . metFORMIN (GLUCOPHAGE) 500 MG tablet 500 mg in the morning and at bedtime.    . sertraline (ZOLOFT) 100 MG tablet     . vitamin B-12 (CYANOCOBALAMIN) 500 MCG tablet Take 500 mcg by mouth daily.  No current facility-administered medications on file prior to visit.   Physical exam:  Today's Vitals   02/21/20 0938  BP: 116/71  Pulse: 71  Weight: 253 lb (114.8 kg)  Height: 6' (1.829 m)   Body mass index is 34.31 kg/m.   Wt Readings from Last 3 Encounters:  02/21/20 253 lb (114.8 kg)  08/22/19 (!) 253 lb (114.8 kg)  07/11/19 255 lb (115.7 kg)     Ht Readings from Last 3 Encounters:  02/21/20 6' (1.829 m)  08/22/19 6' (1.829 m)  07/11/19 6' (1.829 m)    He is  fully vaccinated.    General: The patient is awake, alert and appears not in acute distress. The patient is well groomed. Facial hair.  Head: Normocephalic, atraumatic.  Neck is supple. Mallampati  3 plus- very narrow,  neck circumference: 19.5  inches . Nasal airflow patent.  Retrognathia is seen.  Dental status:  Cardiovascular:  Regular rate and cardiac rhythm by pulse,  without distended neck veins. Respiratory: Lungs are clear to auscultation.  Skin:  Without evidence of ankle edema, or rash. Trunk: The patient's posture is erect.   Neurologic exam : The patient is awake and alert, oriented to place and time.   Memory subjective described as intact.  Attention span & concentration ability appears normal.  Speech is fluent,  without  dysarthria, dysphonia or aphasia.  Mood and affect are appropriate.   Cranial nerves: no loss of smell or taste reported  Pupils are equal and briskly reactive to light. Funduscopic exam deferred,  Extraocular movements in vertical and horizontal planes were intact and without nystagmus. No Diplopia. Visual fields by finger perimetry are intact. Hearing was reduced- limited- he speaks loudly.  Hearing aids are in place . Tinnitus .  Facial sensation intact to fine touch.  Facial motor strength is symmetric and tongue and uvula move midline.  Neck ROM : rotation, tilt and flexion extension were normal for age and shoulder shrug was symmetrical.    Motor exam:  Symmetric bulk, tone and ROM.    The patient has arthritic changes to his hand including his dominant right hand from previous injuries.  He does not report any change in penmanship or sensation to the fingertips. Normal tone without cog wheeling, symmetric grip strength . Sensory:  Fine touch,and vibration were normal.    Coordination: Rapid alternating movements in the fingers/hands were of normal speed.  The Finger-to-nose maneuver was intact without evidence of ataxia, dysmetria or  tremor. Gait and station: Patient could rise unassisted from a seated position, he braced himself.  Toe and heel walk were deferred.  Deep tendon reflexes: in the  upper and lower extremities are symmetrically attenuated- trace only .  Babinski response was deferred.     So apparently there is no nocturia he has used the machine only on 13 out of 60 days to a degree that was over 4 hours consecutively.  He has quite significant air leaks during that time.  The 95th percentile air leak is 34.6.  The 95th percentile pressure is 11.3 and his residual AHI was only 2.1.  From the pressure settings I think this works very well for him but we have to find a mask that will reduce the air leakage.   I also needed him to understand that he needs to use the machine every night for 4 hours or more in order to be compliant by the insurance standards.    Today's fatigue severity score was  endorsed at 31 out of 63 points and the Epworth Sleepiness Scale at 10 out of 24 points this is a endorsement not considering how many daytime naps the patient may take.  He endorsed quite significant sleepiness if he is at rest not stimulated and not physically active and also after lunchtime.  He is using a nasal pillow and he likes it- he has facial hair that would likely interfere with a FFM seal.  I asked him to bring the CPAP to where e is most likely to go to sleep, and if it is in his recliner, he should place the CPAP right there. He denies a dry mouth when using CPAP.     After spending a total time of 21 minutes face to face and additional time for physical and neurologic examination, review of laboratory studies,  personal review of imaging studies, reports and results of other testing and review of referral information / records as far as provided in visit, I have established the following Plan to proceed with:  1)  Poor compliance with CPAP use.  He is using a nasal pillow and he likes it- he has facial hair  that would likely interfere with a FFM seal.  I asked him to bring the CPAP to where e is most likely to go to sleep, and if it is in his recliner, he should place the CPAP right there. He denies a dry mouth when using CPAP.      I would like to thank Lewis Moccasin, MD  for allowing me to meet with and to take care of this pleasant patient. Fully vaccinated formerly morbidly obese CPAP user , who underwent bariatric surgery.  Never had been rechecked if apnea resolved.  I plan to follow up either personally or through our NP within 6  month.   CC: I will share my notes with PCP   Electronically signed by: Melvyn Novas, MD 02/21/2020 10:07 AM  [facility] and [facility] Board certified by The [employer] of Sleep Medicine and Diplomate of the [employer] of Sleep Medicine. Board certified In Neurology through the ABPN, Fellow of the [employer] of Neurology. Medical Director of [facility].

## 2020-03-23 DIAGNOSIS — G4733 Obstructive sleep apnea (adult) (pediatric): Secondary | ICD-10-CM | POA: Diagnosis not present

## 2020-04-04 DIAGNOSIS — E1165 Type 2 diabetes mellitus with hyperglycemia: Secondary | ICD-10-CM | POA: Diagnosis not present

## 2020-04-08 DIAGNOSIS — F331 Major depressive disorder, recurrent, moderate: Secondary | ICD-10-CM | POA: Diagnosis not present

## 2020-04-08 DIAGNOSIS — G4700 Insomnia, unspecified: Secondary | ICD-10-CM | POA: Diagnosis not present

## 2020-04-08 DIAGNOSIS — E1165 Type 2 diabetes mellitus with hyperglycemia: Secondary | ICD-10-CM | POA: Diagnosis not present

## 2020-04-08 DIAGNOSIS — G4733 Obstructive sleep apnea (adult) (pediatric): Secondary | ICD-10-CM | POA: Diagnosis not present

## 2020-04-08 DIAGNOSIS — F411 Generalized anxiety disorder: Secondary | ICD-10-CM | POA: Diagnosis not present

## 2020-04-08 DIAGNOSIS — E782 Mixed hyperlipidemia: Secondary | ICD-10-CM | POA: Diagnosis not present

## 2020-04-20 DIAGNOSIS — G4733 Obstructive sleep apnea (adult) (pediatric): Secondary | ICD-10-CM | POA: Diagnosis not present

## 2020-05-21 DIAGNOSIS — G4733 Obstructive sleep apnea (adult) (pediatric): Secondary | ICD-10-CM | POA: Diagnosis not present

## 2020-06-20 DIAGNOSIS — G4733 Obstructive sleep apnea (adult) (pediatric): Secondary | ICD-10-CM | POA: Diagnosis not present

## 2020-07-21 DIAGNOSIS — G4733 Obstructive sleep apnea (adult) (pediatric): Secondary | ICD-10-CM | POA: Diagnosis not present

## 2020-08-14 ENCOUNTER — Encounter: Payer: Self-pay | Admitting: Adult Health

## 2020-08-14 ENCOUNTER — Ambulatory Visit: Payer: Medicare PPO | Admitting: Adult Health

## 2020-08-20 DIAGNOSIS — G4733 Obstructive sleep apnea (adult) (pediatric): Secondary | ICD-10-CM | POA: Diagnosis not present

## 2021-10-01 DIAGNOSIS — E119 Type 2 diabetes mellitus without complications: Secondary | ICD-10-CM | POA: Diagnosis not present

## 2021-10-01 DIAGNOSIS — Z125 Encounter for screening for malignant neoplasm of prostate: Secondary | ICD-10-CM | POA: Diagnosis not present

## 2021-10-01 DIAGNOSIS — F32A Depression, unspecified: Secondary | ICD-10-CM | POA: Diagnosis not present

## 2021-10-01 DIAGNOSIS — Z635 Disruption of family by separation and divorce: Secondary | ICD-10-CM | POA: Diagnosis not present

## 2021-10-01 DIAGNOSIS — E538 Deficiency of other specified B group vitamins: Secondary | ICD-10-CM | POA: Diagnosis not present

## 2021-10-01 DIAGNOSIS — J309 Allergic rhinitis, unspecified: Secondary | ICD-10-CM | POA: Diagnosis not present

## 2021-10-01 DIAGNOSIS — Z87891 Personal history of nicotine dependence: Secondary | ICD-10-CM | POA: Diagnosis not present

## 2021-10-01 DIAGNOSIS — I998 Other disorder of circulatory system: Secondary | ICD-10-CM | POA: Diagnosis not present

## 2021-10-01 DIAGNOSIS — E785 Hyperlipidemia, unspecified: Secondary | ICD-10-CM | POA: Diagnosis not present

## 2021-10-28 DIAGNOSIS — Z87891 Personal history of nicotine dependence: Secondary | ICD-10-CM | POA: Diagnosis not present

## 2021-11-04 DIAGNOSIS — I872 Venous insufficiency (chronic) (peripheral): Secondary | ICD-10-CM | POA: Diagnosis not present

## 2021-11-11 DIAGNOSIS — Z20822 Contact with and (suspected) exposure to covid-19: Secondary | ICD-10-CM | POA: Diagnosis not present

## 2021-11-11 DIAGNOSIS — J069 Acute upper respiratory infection, unspecified: Secondary | ICD-10-CM | POA: Diagnosis not present

## 2021-12-02 DIAGNOSIS — I872 Venous insufficiency (chronic) (peripheral): Secondary | ICD-10-CM | POA: Diagnosis not present

## 2021-12-26 DIAGNOSIS — D122 Benign neoplasm of ascending colon: Secondary | ICD-10-CM | POA: Diagnosis not present

## 2021-12-26 DIAGNOSIS — Z86010 Personal history of colonic polyps: Secondary | ICD-10-CM | POA: Diagnosis not present

## 2021-12-26 DIAGNOSIS — D120 Benign neoplasm of cecum: Secondary | ICD-10-CM | POA: Diagnosis not present

## 2021-12-26 DIAGNOSIS — D123 Benign neoplasm of transverse colon: Secondary | ICD-10-CM | POA: Diagnosis not present

## 2021-12-26 DIAGNOSIS — D126 Benign neoplasm of colon, unspecified: Secondary | ICD-10-CM | POA: Diagnosis not present

## 2021-12-26 DIAGNOSIS — Z1211 Encounter for screening for malignant neoplasm of colon: Secondary | ICD-10-CM | POA: Diagnosis not present

## 2022-01-22 DIAGNOSIS — I998 Other disorder of circulatory system: Secondary | ICD-10-CM | POA: Diagnosis not present

## 2022-01-22 DIAGNOSIS — D696 Thrombocytopenia, unspecified: Secondary | ICD-10-CM | POA: Diagnosis not present

## 2022-01-22 DIAGNOSIS — E119 Type 2 diabetes mellitus without complications: Secondary | ICD-10-CM | POA: Diagnosis not present

## 2022-01-22 DIAGNOSIS — I10 Essential (primary) hypertension: Secondary | ICD-10-CM | POA: Diagnosis not present

## 2022-01-22 DIAGNOSIS — Z0000 Encounter for general adult medical examination without abnormal findings: Secondary | ICD-10-CM | POA: Diagnosis not present

## 2022-01-22 DIAGNOSIS — Z23 Encounter for immunization: Secondary | ICD-10-CM | POA: Diagnosis not present

## 2022-01-22 DIAGNOSIS — Z125 Encounter for screening for malignant neoplasm of prostate: Secondary | ICD-10-CM | POA: Diagnosis not present

## 2022-01-29 DIAGNOSIS — I872 Venous insufficiency (chronic) (peripheral): Secondary | ICD-10-CM | POA: Diagnosis not present

## 2022-02-23 DIAGNOSIS — I872 Venous insufficiency (chronic) (peripheral): Secondary | ICD-10-CM | POA: Diagnosis not present

## 2022-03-09 DIAGNOSIS — I83812 Varicose veins of left lower extremities with pain: Secondary | ICD-10-CM | POA: Diagnosis not present

## 2022-03-16 DIAGNOSIS — I83812 Varicose veins of left lower extremities with pain: Secondary | ICD-10-CM | POA: Diagnosis not present

## 2022-03-23 DIAGNOSIS — I872 Venous insufficiency (chronic) (peripheral): Secondary | ICD-10-CM | POA: Diagnosis not present

## 2022-04-13 DIAGNOSIS — I83811 Varicose veins of right lower extremities with pain: Secondary | ICD-10-CM | POA: Diagnosis not present

## 2022-04-28 DIAGNOSIS — I83811 Varicose veins of right lower extremities with pain: Secondary | ICD-10-CM | POA: Diagnosis not present

## 2022-05-12 DIAGNOSIS — I872 Venous insufficiency (chronic) (peripheral): Secondary | ICD-10-CM | POA: Diagnosis not present

## 2022-05-25 DIAGNOSIS — R200 Anesthesia of skin: Secondary | ICD-10-CM | POA: Diagnosis not present

## 2022-05-25 DIAGNOSIS — Z0000 Encounter for general adult medical examination without abnormal findings: Secondary | ICD-10-CM | POA: Diagnosis not present

## 2022-05-25 DIAGNOSIS — D696 Thrombocytopenia, unspecified: Secondary | ICD-10-CM | POA: Diagnosis not present

## 2022-05-25 DIAGNOSIS — E119 Type 2 diabetes mellitus without complications: Secondary | ICD-10-CM | POA: Diagnosis not present

## 2022-05-25 DIAGNOSIS — F32A Depression, unspecified: Secondary | ICD-10-CM | POA: Diagnosis not present

## 2022-05-25 DIAGNOSIS — Z794 Long term (current) use of insulin: Secondary | ICD-10-CM | POA: Diagnosis not present

## 2022-05-25 DIAGNOSIS — E785 Hyperlipidemia, unspecified: Secondary | ICD-10-CM | POA: Diagnosis not present

## 2022-08-24 DIAGNOSIS — E785 Hyperlipidemia, unspecified: Secondary | ICD-10-CM | POA: Diagnosis not present

## 2022-08-24 DIAGNOSIS — F32A Depression, unspecified: Secondary | ICD-10-CM | POA: Diagnosis not present

## 2022-08-24 DIAGNOSIS — R200 Anesthesia of skin: Secondary | ICD-10-CM | POA: Diagnosis not present

## 2022-08-24 DIAGNOSIS — E119 Type 2 diabetes mellitus without complications: Secondary | ICD-10-CM | POA: Diagnosis not present

## 2022-09-15 DIAGNOSIS — H0288A Meibomian gland dysfunction right eye, upper and lower eyelids: Secondary | ICD-10-CM | POA: Diagnosis not present

## 2022-09-15 DIAGNOSIS — I10 Essential (primary) hypertension: Secondary | ICD-10-CM | POA: Diagnosis not present

## 2022-09-15 DIAGNOSIS — H25813 Combined forms of age-related cataract, bilateral: Secondary | ICD-10-CM | POA: Diagnosis not present

## 2022-09-15 DIAGNOSIS — E119 Type 2 diabetes mellitus without complications: Secondary | ICD-10-CM | POA: Diagnosis not present

## 2022-09-21 DIAGNOSIS — E785 Hyperlipidemia, unspecified: Secondary | ICD-10-CM | POA: Diagnosis not present

## 2022-09-21 DIAGNOSIS — E1129 Type 2 diabetes mellitus with other diabetic kidney complication: Secondary | ICD-10-CM | POA: Diagnosis not present

## 2022-09-29 DIAGNOSIS — E875 Hyperkalemia: Secondary | ICD-10-CM | POA: Diagnosis not present

## 2022-09-29 DIAGNOSIS — I872 Venous insufficiency (chronic) (peripheral): Secondary | ICD-10-CM | POA: Diagnosis not present

## 2022-09-29 DIAGNOSIS — E1129 Type 2 diabetes mellitus with other diabetic kidney complication: Secondary | ICD-10-CM | POA: Diagnosis not present

## 2022-10-06 DIAGNOSIS — H4322 Crystalline deposits in vitreous body, left eye: Secondary | ICD-10-CM | POA: Diagnosis not present

## 2022-10-06 DIAGNOSIS — H18451 Nodular corneal degeneration, right eye: Secondary | ICD-10-CM | POA: Diagnosis not present

## 2022-10-06 DIAGNOSIS — H2513 Age-related nuclear cataract, bilateral: Secondary | ICD-10-CM | POA: Diagnosis not present

## 2022-10-06 DIAGNOSIS — E113293 Type 2 diabetes mellitus with mild nonproliferative diabetic retinopathy without macular edema, bilateral: Secondary | ICD-10-CM | POA: Diagnosis not present

## 2022-10-23 DIAGNOSIS — H2512 Age-related nuclear cataract, left eye: Secondary | ICD-10-CM | POA: Diagnosis not present

## 2022-11-05 DIAGNOSIS — H2511 Age-related nuclear cataract, right eye: Secondary | ICD-10-CM | POA: Diagnosis not present

## 2022-11-05 DIAGNOSIS — H21561 Pupillary abnormality, right eye: Secondary | ICD-10-CM | POA: Diagnosis not present

## 2022-11-14 DIAGNOSIS — E11621 Type 2 diabetes mellitus with foot ulcer: Secondary | ICD-10-CM | POA: Diagnosis not present

## 2022-11-14 DIAGNOSIS — Z6834 Body mass index (BMI) 34.0-34.9, adult: Secondary | ICD-10-CM | POA: Diagnosis not present

## 2022-11-14 DIAGNOSIS — E669 Obesity, unspecified: Secondary | ICD-10-CM | POA: Diagnosis not present

## 2022-11-14 DIAGNOSIS — L03031 Cellulitis of right toe: Secondary | ICD-10-CM | POA: Diagnosis not present

## 2022-11-20 DIAGNOSIS — E11621 Type 2 diabetes mellitus with foot ulcer: Secondary | ICD-10-CM | POA: Diagnosis not present

## 2022-11-20 DIAGNOSIS — L03115 Cellulitis of right lower limb: Secondary | ICD-10-CM | POA: Diagnosis not present

## 2022-11-20 DIAGNOSIS — L97516 Non-pressure chronic ulcer of other part of right foot with bone involvement without evidence of necrosis: Secondary | ICD-10-CM | POA: Diagnosis not present

## 2022-11-20 DIAGNOSIS — L97512 Non-pressure chronic ulcer of other part of right foot with fat layer exposed: Secondary | ICD-10-CM | POA: Diagnosis not present

## 2022-11-20 DIAGNOSIS — M86171 Other acute osteomyelitis, right ankle and foot: Secondary | ICD-10-CM | POA: Diagnosis not present

## 2022-11-23 DIAGNOSIS — R809 Proteinuria, unspecified: Secondary | ICD-10-CM | POA: Diagnosis not present

## 2022-11-23 DIAGNOSIS — E11621 Type 2 diabetes mellitus with foot ulcer: Secondary | ICD-10-CM | POA: Diagnosis not present

## 2022-11-23 DIAGNOSIS — L97516 Non-pressure chronic ulcer of other part of right foot with bone involvement without evidence of necrosis: Secondary | ICD-10-CM | POA: Diagnosis not present

## 2022-11-23 DIAGNOSIS — F32A Depression, unspecified: Secondary | ICD-10-CM | POA: Diagnosis not present

## 2022-11-23 DIAGNOSIS — R200 Anesthesia of skin: Secondary | ICD-10-CM | POA: Diagnosis not present

## 2022-11-23 DIAGNOSIS — L97519 Non-pressure chronic ulcer of other part of right foot with unspecified severity: Secondary | ICD-10-CM | POA: Diagnosis not present

## 2022-11-23 DIAGNOSIS — R911 Solitary pulmonary nodule: Secondary | ICD-10-CM | POA: Diagnosis not present

## 2022-11-23 DIAGNOSIS — L97502 Non-pressure chronic ulcer of other part of unspecified foot with fat layer exposed: Secondary | ICD-10-CM | POA: Diagnosis not present

## 2022-11-30 DIAGNOSIS — R911 Solitary pulmonary nodule: Secondary | ICD-10-CM | POA: Diagnosis not present

## 2022-11-30 DIAGNOSIS — J9811 Atelectasis: Secondary | ICD-10-CM | POA: Diagnosis not present

## 2022-12-01 DIAGNOSIS — L97516 Non-pressure chronic ulcer of other part of right foot with bone involvement without evidence of necrosis: Secondary | ICD-10-CM | POA: Diagnosis not present

## 2022-12-01 DIAGNOSIS — E11621 Type 2 diabetes mellitus with foot ulcer: Secondary | ICD-10-CM | POA: Diagnosis not present

## 2022-12-08 DIAGNOSIS — E11628 Type 2 diabetes mellitus with other skin complications: Secondary | ICD-10-CM | POA: Diagnosis not present

## 2022-12-08 DIAGNOSIS — L03031 Cellulitis of right toe: Secondary | ICD-10-CM | POA: Diagnosis not present

## 2022-12-08 DIAGNOSIS — E119 Type 2 diabetes mellitus without complications: Secondary | ICD-10-CM | POA: Diagnosis not present

## 2022-12-08 DIAGNOSIS — D649 Anemia, unspecified: Secondary | ICD-10-CM | POA: Diagnosis not present

## 2022-12-08 DIAGNOSIS — L97516 Non-pressure chronic ulcer of other part of right foot with bone involvement without evidence of necrosis: Secondary | ICD-10-CM | POA: Diagnosis not present

## 2022-12-08 DIAGNOSIS — I10 Essential (primary) hypertension: Secondary | ICD-10-CM | POA: Diagnosis not present

## 2022-12-08 DIAGNOSIS — E1152 Type 2 diabetes mellitus with diabetic peripheral angiopathy with gangrene: Secondary | ICD-10-CM | POA: Diagnosis not present

## 2022-12-08 DIAGNOSIS — E11621 Type 2 diabetes mellitus with foot ulcer: Secondary | ICD-10-CM | POA: Diagnosis not present

## 2022-12-08 DIAGNOSIS — M868X7 Other osteomyelitis, ankle and foot: Secondary | ICD-10-CM | POA: Diagnosis not present

## 2022-12-08 DIAGNOSIS — Z7984 Long term (current) use of oral hypoglycemic drugs: Secondary | ICD-10-CM | POA: Diagnosis not present

## 2022-12-08 DIAGNOSIS — E785 Hyperlipidemia, unspecified: Secondary | ICD-10-CM | POA: Diagnosis not present

## 2022-12-08 DIAGNOSIS — S91109A Unspecified open wound of unspecified toe(s) without damage to nail, initial encounter: Secondary | ICD-10-CM | POA: Diagnosis not present

## 2022-12-08 DIAGNOSIS — Z89421 Acquired absence of other right toe(s): Secondary | ICD-10-CM | POA: Diagnosis not present

## 2022-12-08 DIAGNOSIS — K8020 Calculus of gallbladder without cholecystitis without obstruction: Secondary | ICD-10-CM | POA: Diagnosis not present

## 2022-12-08 DIAGNOSIS — Z87891 Personal history of nicotine dependence: Secondary | ICD-10-CM | POA: Diagnosis not present

## 2022-12-08 DIAGNOSIS — I96 Gangrene, not elsewhere classified: Secondary | ICD-10-CM | POA: Diagnosis not present

## 2022-12-08 DIAGNOSIS — M85871 Other specified disorders of bone density and structure, right ankle and foot: Secondary | ICD-10-CM | POA: Diagnosis not present

## 2022-12-08 DIAGNOSIS — S81802A Unspecified open wound, left lower leg, initial encounter: Secondary | ICD-10-CM | POA: Diagnosis not present

## 2022-12-08 DIAGNOSIS — N3289 Other specified disorders of bladder: Secondary | ICD-10-CM | POA: Diagnosis not present

## 2022-12-08 DIAGNOSIS — I7389 Other specified peripheral vascular diseases: Secondary | ICD-10-CM | POA: Diagnosis not present

## 2022-12-08 DIAGNOSIS — M86171 Other acute osteomyelitis, right ankle and foot: Secondary | ICD-10-CM | POA: Diagnosis not present

## 2022-12-08 DIAGNOSIS — N179 Acute kidney failure, unspecified: Secondary | ICD-10-CM | POA: Diagnosis not present

## 2022-12-08 DIAGNOSIS — E1169 Type 2 diabetes mellitus with other specified complication: Secondary | ICD-10-CM | POA: Diagnosis not present

## 2022-12-08 DIAGNOSIS — L97519 Non-pressure chronic ulcer of other part of right foot with unspecified severity: Secondary | ICD-10-CM | POA: Diagnosis not present

## 2022-12-08 DIAGNOSIS — I70211 Atherosclerosis of native arteries of extremities with intermittent claudication, right leg: Secondary | ICD-10-CM | POA: Diagnosis not present

## 2022-12-08 DIAGNOSIS — K828 Other specified diseases of gallbladder: Secondary | ICD-10-CM | POA: Diagnosis not present

## 2022-12-08 DIAGNOSIS — S81801A Unspecified open wound, right lower leg, initial encounter: Secondary | ICD-10-CM | POA: Diagnosis not present

## 2022-12-08 DIAGNOSIS — E1140 Type 2 diabetes mellitus with diabetic neuropathy, unspecified: Secondary | ICD-10-CM | POA: Diagnosis not present
